# Patient Record
Sex: Male | Born: 1990 | Race: Black or African American | Hispanic: No | Marital: Single | Smoking: Never smoker
Health system: Southern US, Community
[De-identification: ages and names within clinical notes are randomized; demographics above are authoritative.]

## PROBLEM LIST (undated history)

## (undated) DIAGNOSIS — K5010 Crohn's disease of large intestine without complications: Secondary | ICD-10-CM

## (undated) DIAGNOSIS — L739 Follicular disorder, unspecified: Secondary | ICD-10-CM

---

## 2013-04-26 ENCOUNTER — Emergency Department: Payer: Self-pay | Admitting: Emergency Medicine

## 2017-04-02 ENCOUNTER — Other Ambulatory Visit: Payer: Self-pay

## 2017-04-02 ENCOUNTER — Encounter (HOSPITAL_COMMUNITY): Payer: Self-pay | Admitting: *Deleted

## 2017-04-02 DIAGNOSIS — Z79899 Other long term (current) drug therapy: Secondary | ICD-10-CM | POA: Diagnosis not present

## 2017-04-02 DIAGNOSIS — R109 Unspecified abdominal pain: Secondary | ICD-10-CM | POA: Insufficient documentation

## 2017-04-02 DIAGNOSIS — R55 Syncope and collapse: Secondary | ICD-10-CM | POA: Diagnosis not present

## 2017-04-02 DIAGNOSIS — M25562 Pain in left knee: Secondary | ICD-10-CM | POA: Insufficient documentation

## 2017-04-02 DIAGNOSIS — M79642 Pain in left hand: Secondary | ICD-10-CM | POA: Diagnosis not present

## 2017-04-02 DIAGNOSIS — L739 Follicular disorder, unspecified: Secondary | ICD-10-CM | POA: Diagnosis not present

## 2017-04-02 DIAGNOSIS — Z91041 Radiographic dye allergy status: Secondary | ICD-10-CM | POA: Diagnosis not present

## 2017-04-02 DIAGNOSIS — K5090 Crohn's disease, unspecified, without complications: Secondary | ICD-10-CM | POA: Diagnosis not present

## 2017-04-02 DIAGNOSIS — E861 Hypovolemia: Secondary | ICD-10-CM | POA: Insufficient documentation

## 2017-04-02 DIAGNOSIS — W1830XA Fall on same level, unspecified, initial encounter: Secondary | ICD-10-CM | POA: Insufficient documentation

## 2017-04-02 LAB — URINALYSIS, ROUTINE W REFLEX MICROSCOPIC
Bilirubin Urine: NEGATIVE
Glucose, UA: NEGATIVE mg/dL
Hgb urine dipstick: NEGATIVE
Ketones, ur: NEGATIVE mg/dL
LEUKOCYTES UA: NEGATIVE
NITRITE: NEGATIVE
PROTEIN: NEGATIVE mg/dL
Specific Gravity, Urine: 1.01 (ref 1.005–1.030)
pH: 9 — ABNORMAL HIGH (ref 5.0–8.0)

## 2017-04-02 LAB — CBC
HCT: 39.1 % (ref 39.0–52.0)
Hemoglobin: 13.6 g/dL (ref 13.0–17.0)
MCH: 30.6 pg (ref 26.0–34.0)
MCHC: 34.8 g/dL (ref 30.0–36.0)
MCV: 87.9 fL (ref 78.0–100.0)
PLATELETS: 330 10*3/uL (ref 150–400)
RBC: 4.45 MIL/uL (ref 4.22–5.81)
RDW: 13 % (ref 11.5–15.5)
WBC: 9.8 10*3/uL (ref 4.0–10.5)

## 2017-04-02 NOTE — ED Triage Notes (Signed)
Patient says that he has not been feeling well today. He has been having a chron's flare up over the past several weeks. This evening was outside and passed out. He c/o pain in the left jaw, left hand and left knee. He says that he has had several similar episodes recently. Has had some blood in his stool "but nothing significant today" .

## 2017-04-03 ENCOUNTER — Observation Stay (HOSPITAL_COMMUNITY)
Admission: EM | Admit: 2017-04-03 | Discharge: 2017-04-03 | Disposition: A | Discharge status: Home / Self Care | Payer: Medicaid Other | Attending: Internal Medicine | Admitting: Internal Medicine

## 2017-04-03 ENCOUNTER — Observation Stay (HOSPITAL_BASED_OUTPATIENT_CLINIC_OR_DEPARTMENT_OTHER): Payer: Medicaid Other

## 2017-04-03 ENCOUNTER — Emergency Department (HOSPITAL_COMMUNITY): Payer: Medicaid Other

## 2017-04-03 ENCOUNTER — Encounter (HOSPITAL_COMMUNITY): Payer: Self-pay | Admitting: Family Medicine

## 2017-04-03 DIAGNOSIS — L739 Follicular disorder, unspecified: Secondary | ICD-10-CM | POA: Diagnosis not present

## 2017-04-03 DIAGNOSIS — K5090 Crohn's disease, unspecified, without complications: Secondary | ICD-10-CM

## 2017-04-03 DIAGNOSIS — K5010 Crohn's disease of large intestine without complications: Secondary | ICD-10-CM | POA: Diagnosis not present

## 2017-04-03 DIAGNOSIS — I340 Nonrheumatic mitral (valve) insufficiency: Secondary | ICD-10-CM

## 2017-04-03 DIAGNOSIS — R55 Syncope and collapse: Secondary | ICD-10-CM | POA: Diagnosis present

## 2017-04-03 DIAGNOSIS — I951 Orthostatic hypotension: Secondary | ICD-10-CM | POA: Diagnosis present

## 2017-04-03 HISTORY — DX: Follicular disorder, unspecified: L73.9

## 2017-04-03 HISTORY — DX: Crohn's disease of large intestine without complications: K50.10

## 2017-04-03 LAB — BASIC METABOLIC PANEL
ANION GAP: 9 (ref 5–15)
Anion gap: 7 (ref 5–15)
CALCIUM: 9.1 mg/dL (ref 8.9–10.3)
CALCIUM: 8.3 mg/dL — AB (ref 8.9–10.3)
CO2: 27 mmol/L (ref 22–32)
CO2: 25 mmol/L (ref 22–32)
Chloride: 97 mmol/L — ABNORMAL LOW (ref 101–111)
Chloride: 104 mmol/L (ref 101–111)
Creatinine, Ser: 1.08 mg/dL (ref 0.61–1.24)
Creatinine, Ser: 0.9 mg/dL (ref 0.61–1.24)
GFR calc Af Amer: >60 mL/min (ref >60)
GFR calc Af Amer: >60 mL/min (ref >60)
GLUCOSE: 108 mg/dL — AB (ref 65–99)
Glucose, Bld: 104 mg/dL — ABNORMAL HIGH (ref 65–99)
POTASSIUM: 3.9 mmol/L (ref 3.5–5.1)
POTASSIUM: 3.8 mmol/L (ref 3.5–5.1)
SODIUM: 136 mmol/L (ref 135–145)
Sodium: 133 mmol/L — ABNORMAL LOW (ref 135–145)

## 2017-04-03 LAB — RAPID URINE DRUG SCREEN, HOSP PERFORMED
Amphetamines: NOT DETECTED
BARBITURATES: NOT DETECTED
BENZODIAZEPINES: NOT DETECTED
COCAINE: NOT DETECTED
Opiates: NOT DETECTED
TETRAHYDROCANNABINOL: POSITIVE — AB

## 2017-04-03 LAB — CBC
HEMATOCRIT: 34.7 % — AB (ref 39.0–52.0)
Hemoglobin: 11.7 g/dL — ABNORMAL LOW (ref 13.0–17.0)
MCH: 29.6 pg (ref 26.0–34.0)
MCHC: 33.7 g/dL (ref 30.0–36.0)
MCV: 87.8 fL (ref 78.0–100.0)
PLATELETS: 302 10*3/uL (ref 150–400)
RBC: 3.95 MIL/uL — ABNORMAL LOW (ref 4.22–5.81)
RDW: 13 % (ref 11.5–15.5)
WBC: 9 10*3/uL (ref 4.0–10.5)

## 2017-04-03 LAB — SEDIMENTATION RATE: Sed Rate: 25 mm/hr — ABNORMAL HIGH (ref 0–16)

## 2017-04-03 LAB — HIV ANTIBODY (ROUTINE TESTING W REFLEX): HIV Screen 4th Generation wRfx: NONREACTIVE

## 2017-04-03 LAB — ECHOCARDIOGRAM COMPLETE

## 2017-04-03 LAB — I-STAT CG4 LACTIC ACID, ED: Lactic Acid, Venous: 0.98 mmol/L (ref 0.5–1.9)

## 2017-04-03 LAB — C-REACTIVE PROTEIN: CRP: 1.9 mg/dL — ABNORMAL HIGH (ref <1.0)

## 2017-04-03 LAB — CBG MONITORING, ED: GLUCOSE-CAPILLARY: 112 mg/dL — AB (ref 65–99)

## 2017-04-03 MED ORDER — ONDANSETRON HCL 4 MG/2ML IJ SOLN: 4.0000 mg | Freq: Four times a day (QID) | INTRAMUSCULAR | Status: DC | PRN

## 2017-04-03 MED ORDER — SODIUM CHLORIDE 0.9% FLUSH: 3.0000 mL | Freq: Two times a day (BID) | INTRAVENOUS | Status: DC

## 2017-04-03 MED ORDER — DICYCLOMINE HCL 10 MG PO CAPS: 10.0000 mg | ORAL_CAPSULE | Freq: Four times a day (QID) | ORAL | Status: DC | PRN

## 2017-04-03 MED ORDER — SODIUM CHLORIDE 0.9 % IV BOLUS (SEPSIS)
1000.0000 mL | Freq: Once | INTRAVENOUS | Status: AC
  Administered 2017-04-03: 1000 mL via INTRAVENOUS

## 2017-04-03 MED ORDER — FLUOCINONIDE 0.05 % EX OINT
TOPICAL_OINTMENT | Freq: Three times a day (TID) | CUTANEOUS | Status: DC
  Filled 2017-04-03 (×2): qty 15

## 2017-04-03 MED ORDER — DOXYCYCLINE HYCLATE 100 MG PO TABS: 100.0000 mg | ORAL_TABLET | Freq: Two times a day (BID) | ORAL | 0 refills | Status: AC

## 2017-04-03 MED ORDER — PREDNISONE 20 MG PO TABS: 40.0000 mg | ORAL_TABLET | Freq: Every day | ORAL | Status: DC

## 2017-04-03 MED ORDER — METHYLPREDNISOLONE SODIUM SUCC 125 MG IJ SOLR
80.0000 mg | Freq: Once | INTRAMUSCULAR | Status: AC
  Administered 2017-04-03: 80 mg via INTRAVENOUS
  Filled 2017-04-03: qty 2

## 2017-04-03 MED ORDER — HYDROCODONE-ACETAMINOPHEN 5-325 MG PO TABS
1.0000 | ORAL_TABLET | ORAL | Status: DC | PRN
  Administered 2017-04-03: 1 via ORAL
  Administered 2017-04-03: 2 via ORAL
  Filled 2017-04-03: qty 1
  Filled 2017-04-03: qty 2

## 2017-04-03 MED ORDER — ADULT MULTIVITAMIN W/MINERALS CH
1.0000 | ORAL_TABLET | Freq: Every day | ORAL | Status: DC
  Administered 2017-04-03: 1 via ORAL
  Filled 2017-04-03: qty 1

## 2017-04-03 MED ORDER — FENTANYL CITRATE (PF) 100 MCG/2ML IJ SOLN
50.0000 ug | Freq: Once | INTRAMUSCULAR | Status: AC
  Administered 2017-04-03: 50 ug via INTRAVENOUS
  Filled 2017-04-03: qty 2

## 2017-04-03 MED ORDER — DOXYCYCLINE HYCLATE 100 MG PO TABS
100.0000 mg | ORAL_TABLET | Freq: Two times a day (BID) | ORAL | Status: DC
  Administered 2017-04-03: 100 mg via ORAL
  Filled 2017-04-03: qty 1

## 2017-04-03 MED ORDER — ONDANSETRON HCL 4 MG/2ML IJ SOLN
4.0000 mg | Freq: Once | INTRAMUSCULAR | Status: AC
  Administered 2017-04-03: 4 mg via INTRAVENOUS
  Filled 2017-04-03: qty 2

## 2017-04-03 MED ORDER — ONDANSETRON HCL 4 MG PO TABS: 4.0000 mg | ORAL_TABLET | Freq: Four times a day (QID) | ORAL | 0 refills | Status: AC | PRN

## 2017-04-03 MED ORDER — TRAMADOL HCL 50 MG PO TABS: 50.0000 mg | ORAL_TABLET | Freq: Four times a day (QID) | ORAL | 0 refills | Status: AC | PRN

## 2017-04-03 MED ORDER — ONDANSETRON HCL 4 MG PO TABS: 4.0000 mg | ORAL_TABLET | Freq: Four times a day (QID) | ORAL | Status: DC | PRN

## 2017-04-03 MED ORDER — TETANUS-DIPHTH-ACELL PERTUSSIS 5-2.5-18.5 LF-MCG/0.5 IM SUSP
0.5000 mL | Freq: Once | INTRAMUSCULAR | Status: AC
  Administered 2017-04-03: 0.5 mL via INTRAMUSCULAR
  Filled 2017-04-03: qty 0.5

## 2017-04-03 MED ORDER — ACETAMINOPHEN 325 MG PO TABS
650.0000 mg | ORAL_TABLET | Freq: Four times a day (QID) | ORAL | Status: DC | PRN
  Administered 2017-04-03: 650 mg via ORAL
  Filled 2017-04-03 (×2): qty 2

## 2017-04-03 MED ORDER — SODIUM CHLORIDE 0.9 % IV SOLN
INTRAVENOUS | Status: AC
  Administered 2017-04-03: 06:00:00 via INTRAVENOUS

## 2017-04-03 MED ORDER — DICYCLOMINE HCL 10 MG PO CAPS: 10.0000 mg | ORAL_CAPSULE | Freq: Four times a day (QID) | ORAL | 0 refills | Status: AC | PRN

## 2017-04-03 MED ORDER — ACETAMINOPHEN 650 MG RE SUPP: 650.0000 mg | Freq: Four times a day (QID) | RECTAL | Status: DC | PRN

## 2017-04-03 MED ORDER — PREDNISONE 20 MG PO TABS: 40.0000 mg | ORAL_TABLET | Freq: Every day | ORAL | 0 refills | Status: AC

## 2017-04-03 MED ORDER — FLUOCINONIDE 0.05 % EX OINT: TOPICAL_OINTMENT | Freq: Three times a day (TID) | CUTANEOUS | 0 refills | Status: AC

## 2017-04-03 NOTE — Discharge Summary (Signed)
Physician Discharge Summary  Uber Barwick WTU:[id number] DOB: 09/25/1990 DOA: 04/03/2017  PCP: Donn Pierini  Admit date: 04/03/2017 Discharge date: 04/03/2017  Admitted From: Home Disposition:  Home  Recommendations for Outpatient Follow-up:  1. Follow up with PCP in 1 week 2. Follow up with gastroenterology in a week 3. Patient will benefit from outpatient referral to dermatology   Home Health: No  Equipment/Devices: None  Discharge Condition: Stable  CODE STATUS: Full  Diet recommendation: Regular  Brief/Interim Summary: 26 year old male with history of Crohn's colitis and colon colitis presented with a syncopal episode. Patient was also complaining of suspected Crohn's flare. CT scan of the abdomen and pelvis was negative for acute process. GI evaluated the patient and recommended oral prednisone and outpatient follow-up with the primary gastroenterologist. Echo did not reveal any abnormalities. Patient will be discharged home, as he is hemodynamically stable.  Discharge Diagnoses:  Principal Problem:   Orthostatic syncope Active Problems:   Crohn's colitis (HCC)   Syncope   Folliculitis   1. Syncope  - Pt presents after a syncopal episode shortly after standing and immediately preceded by feeling hot and lightheaded  - Suspected to be orthostatic in setting of several loose stools daily and apparent hypovolemia; orthostatic vitals negative, however - Treated with IV fluids. Improved - Echo was normal -Outpatient follow-up with primary care provider  2. Crohn's colitis  -  Non-contrast CT abd/pelvis negative for acute process - GI evaluation appreciated. GI recommends prednisone 40 mg daily until the patient follows up with the primary gastroenterologist in a week's time who will then decide about continuation versus tapering off prednisone.   3. Folliculitis  - Severe folliculitis noted on presentation; pt reports this is chronic but acutely worsened over past  several days  - Continue doxycycline and topical treatments. -Patient might benefit from outpatient dermatology evaluation   Discharge Instructions  Discharge Instructions    Ambulatory referral to Dermatology   Complete by:  As directed    In 1-2 weeks; Eczema/Folliculitis   Call MD for:  difficulty breathing, headache or visual disturbances   Complete by:  As directed    Call MD for:  extreme fatigue   Complete by:  As directed    Call MD for:  hives   Complete by:  As directed    Call MD for:  persistant dizziness or light-headedness   Complete by:  As directed    Call MD for:  persistant nausea and vomiting   Complete by:  As directed    Call MD for:  severe uncontrolled pain   Complete by:  As directed    Call MD for:  temperature >100.4   Complete by:  As directed    Diet general   Complete by:  As directed    Increase activity slowly   Complete by:  As directed      Allergies as of 04/03/2017      Reactions   Contrast Media [iodinated Diagnostic Agents] Rash, Other (See Comments)   Burning sensation in veins    Peanut-containing Drug Products Rash   Shellfish Allergy Rash      Medication List    STOP taking these medications   ACTIVATED CHARCOAL PO     TAKE these medications   dicyclomine 10 MG capsule Commonly known as:  BENTYL Take 1 capsule (10 mg total) by mouth every 6 (six) hours as needed for spasms (abdominal pain/cramps).   doxycycline 100 MG tablet Commonly known as:  VIBRA-TABS Take 1  tablet (100 mg total) by mouth every 12 (twelve) hours.   fluocinonide ointment 0.05 % Commonly known as:  LIDEX Apply topically 3 (three) times daily.   multivitamin with minerals Tabs tablet Take 1 tablet by mouth daily.   ondansetron 4 MG tablet Commonly known as:  ZOFRAN Take 1 tablet (4 mg total) by mouth every 6 (six) hours as needed for nausea.   predniSONE 20 MG tablet Commonly known as:  DELTASONE Take 2 tablets (40 mg total) by mouth daily  before breakfast. Follow up with GI as an outpatient regarding need to taper or continue Start taking on:  04/04/2017   PROBIOTIC ACIDOPHILUS PO Take 1 tablet by mouth daily.   traMADol 50 MG tablet Commonly known as:  ULTRAM Take 1 tablet (50 mg total) by mouth every 6 (six) hours as needed for severe pain.      Follow-up Information    Donn Pierini. Schedule an appointment as soon as possible for a visit in 1 week(s).   Specialties:  Family Medicine, Sports Medicine Contact information: [address] [place] [address] [phone]        Willa Rough, MD. Schedule an appointment as soon as possible for a visit.   Contact information: [facility] [address] [place] [address] [phone]          Allergies  Allergen Reactions  . Contrast Media [Iodinated Diagnostic Agents] Rash and Other (See Comments)    Burning sensation in veins   . Peanut-Containing Drug Products Rash  . Shellfish Allergy Rash    Consultations:  GI   Procedures/Studies: Ct Abdomen Pelvis Wo Contrast  Result Date: 04/03/2017 CLINICAL DATA:  26 y/o M; history of Crohn's disease. Evaluation Crohn's exacerbation. Patient was dizzy with fall to concrete this evening. EXAM: CT ABDOMEN AND PELVIS WITHOUT CONTRAST TECHNIQUE: Multidetector CT imaging of the abdomen and pelvis was performed following the standard protocol without IV contrast. COMPARISON:  None. FINDINGS: Lower chest: No acute abnormality. Hepatobiliary: No focal liver abnormality is seen. No gallstones, gallbladder wall thickening, or biliary dilatation. Pancreas: Unremarkable. No pancreatic ductal dilatation or surrounding inflammatory changes. Spleen: Normal in size without focal abnormality. Adrenals/Urinary Tract: Adrenal glands are unremarkable. Kidneys are normal, without renal calculi, focal lesion, or hydronephrosis. Bladder is unremarkable. Stomach/Bowel: Stomach is within  normal limits. Appendix appears normal. No evidence of bowel wall thickening, distention, or inflammatory changes. Vascular/Lymphatic: No significant vascular findings are present. No enlarged abdominal or pelvic lymph nodes. Reproductive: Uterus and bilateral adnexa are unremarkable. Other: No abdominal wall hernia or abnormality. No abdominopelvic ascites. Musculoskeletal: No acute or significant osseous findings. IMPRESSION: No acute process identified. Active inflammation of Crohn's disease may not be identified without contrast. CT abdomen and pelvis with contrast or CT/MR enterography are the studies of choice for evaluation of Crohn's disease. Electronically Signed   By: Mitzi Hansen M.D.   On: 04/03/2017 03:01   Ct Head Wo Contrast  Result Date: 04/03/2017 CLINICAL DATA:  26 year old male with trauma. EXAM: CT HEAD WITHOUT CONTRAST CT MAXILLOFACIAL WITHOUT CONTRAST CT CERVICAL SPINE WITHOUT CONTRAST TECHNIQUE: Multidetector CT imaging of the head, cervical spine, and maxillofacial structures were performed using the standard protocol without intravenous contrast. Multiplanar CT image reconstructions of the cervical spine and maxillofacial structures were also generated. COMPARISON:  None. FINDINGS: CT HEAD FINDINGS Brain: No evidence of acute infarction, hemorrhage, hydrocephalus, extra-axial collection or mass lesion/mass effect. Vascular: No hyperdense vessel or unexpected calcification. Skull: Normal. Negative  for fracture or focal lesion. Other: None CT MAXILLOFACIAL FINDINGS Osseous: No fracture or mandibular dislocation. No destructive process. Orbits: Negative. No traumatic or inflammatory finding. Sinuses: Mild mucoperiosteal thickening of paranasal sinuses. No air-fluid levels. The mastoid air cells are clear. Soft tissues: Negative. CT CERVICAL SPINE FINDINGS Alignment: No acute subluxation. Mild reversal of normal cervical lordosis likely positional or due to muscle spasm. Skull  base and vertebrae: No acute fracture. No primary bone lesion or focal pathologic process. Soft tissues and spinal canal: No prevertebral fluid or swelling. No visible canal hematoma. Disc levels:  No acute findings. Upper chest: Negative. Other: None IMPRESSION: 1. Normal noncontrast CT of the brain. 2. No acute facial bone fractures. 3. No acute/traumatic cervical spine pathology. Electronically Signed   By: Elgie Collard M.D.   On: 04/03/2017 02:58   Ct Cervical Spine Wo Contrast  Result Date: 04/03/2017 CLINICAL DATA:  26 year old male with trauma. EXAM: CT HEAD WITHOUT CONTRAST CT MAXILLOFACIAL WITHOUT CONTRAST CT CERVICAL SPINE WITHOUT CONTRAST TECHNIQUE: Multidetector CT imaging of the head, cervical spine, and maxillofacial structures were performed using the standard protocol without intravenous contrast. Multiplanar CT image reconstructions of the cervical spine and maxillofacial structures were also generated. COMPARISON:  None. FINDINGS: CT HEAD FINDINGS Brain: No evidence of acute infarction, hemorrhage, hydrocephalus, extra-axial collection or mass lesion/mass effect. Vascular: No hyperdense vessel or unexpected calcification. Skull: Normal. Negative for fracture or focal lesion. Other: None CT MAXILLOFACIAL FINDINGS Osseous: No fracture or mandibular dislocation. No destructive process. Orbits: Negative. No traumatic or inflammatory finding. Sinuses: Mild mucoperiosteal thickening of paranasal sinuses. No air-fluid levels. The mastoid air cells are clear. Soft tissues: Negative. CT CERVICAL SPINE FINDINGS Alignment: No acute subluxation. Mild reversal of normal cervical lordosis likely positional or due to muscle spasm. Skull base and vertebrae: No acute fracture. No primary bone lesion or focal pathologic process. Soft tissues and spinal canal: No prevertebral fluid or swelling. No visible canal hematoma. Disc levels:  No acute findings. Upper chest: Negative. Other: None IMPRESSION: 1. Normal  noncontrast CT of the brain. 2. No acute facial bone fractures. 3. No acute/traumatic cervical spine pathology. Electronically Signed   By: Elgie Collard M.D.   On: 04/03/2017 02:58   Dg Knee Complete 4 Views Left  Result Date: 04/03/2017 CLINICAL DATA:  26 year old male with fall and left knee pain. EXAM: LEFT KNEE - COMPLETE 4+ VIEW COMPARISON:  None. FINDINGS: No evidence of fracture, dislocation, or joint effusion. No evidence of arthropathy or other focal bone abnormality. Soft tissues are unremarkable. IMPRESSION: Negative. Electronically Signed   By: Elgie Collard M.D.   On: 04/03/2017 02:44   Dg Hand Complete Left  Result Date: 04/03/2017 CLINICAL DATA:  26 year old male with fall and left hand pain. EXAM: LEFT HAND - COMPLETE 3+ VIEW COMPARISON:  None. FINDINGS: There is no evidence of fracture or dislocation. There is no evidence of arthropathy or other focal bone abnormality. Soft tissues are unremarkable. IMPRESSION: Negative. Electronically Signed   By: Elgie Collard M.D.   On: 04/03/2017 02:43   Ct Maxillofacial Wo Contrast  Result Date: 04/03/2017 CLINICAL DATA:  26 year old male with trauma. EXAM: CT HEAD WITHOUT CONTRAST CT MAXILLOFACIAL WITHOUT CONTRAST CT CERVICAL SPINE WITHOUT CONTRAST TECHNIQUE: Multidetector CT imaging of the head, cervical spine, and maxillofacial structures were performed using the standard protocol without intravenous contrast. Multiplanar CT image reconstructions of the cervical spine and maxillofacial structures were also generated. COMPARISON:  None. FINDINGS: CT HEAD FINDINGS Brain: No evidence of acute  infarction, hemorrhage, hydrocephalus, extra-axial collection or mass lesion/mass effect. Vascular: No hyperdense vessel or unexpected calcification. Skull: Normal. Negative for fracture or focal lesion. Other: None CT MAXILLOFACIAL FINDINGS Osseous: No fracture or mandibular dislocation. No destructive process. Orbits: Negative. No traumatic or  inflammatory finding. Sinuses: Mild mucoperiosteal thickening of paranasal sinuses. No air-fluid levels. The mastoid air cells are clear. Soft tissues: Negative. CT CERVICAL SPINE FINDINGS Alignment: No acute subluxation. Mild reversal of normal cervical lordosis likely positional or due to muscle spasm. Skull base and vertebrae: No acute fracture. No primary bone lesion or focal pathologic process. Soft tissues and spinal canal: No prevertebral fluid or swelling. No visible canal hematoma. Disc levels:  No acute findings. Upper chest: Negative. Other: None IMPRESSION: 1. Normal noncontrast CT of the brain. 2. No acute facial bone fractures. 3. No acute/traumatic cervical spine pathology. Electronically Signed   By: Elgie Collard M.D.   On: 04/03/2017 02:58    Echo on 04/03/2017 Study Conclusions  - Left ventricle: The cavity size was normal. There was mild   concentric hypertrophy. Systolic function was vigorous. The   estimated ejection fraction was in the range of 65% to 70%. Wall   motion was normal; there were no regional wall motion   abnormalities. - Aortic valve: There was no regurgitation. - Mitral valve: There was mild regurgitation. - Right ventricle: The cavity size was normal. Wall thickness was   normal. Systolic function was normal. - Right atrium: The atrium was normal in size. - Tricuspid valve: There was trivial regurgitation. - Pulmonic valve: There was no regurgitation. - Pulmonary arteries: Systolic pressure was within the normal   range. - Inferior vena cava: The vessel was normal in size. - Pericardium, extracardiac: There was no pericardial effusion.  Impressions:  - Normal study.   Subjective: Patient seen and examined at bedside. He complains of mild abdominal pain and lower extremity pain. No current nausea or vomiting or fever.  Discharge Exam: Vitals:   04/03/17 1400 04/03/17 1430  BP: 105/70 102/62  Pulse: (!) 59 78  Resp: 13 13  Temp:     SpO2: 98% 98%   Vitals:   04/03/17 1200 04/03/17 1330 04/03/17 1400 04/03/17 1430  BP: (!) 107/59 123/72 105/70 102/62  Pulse: 87 (!) 107 (!) 59 78  Resp: 18 17 13 13   Temp:      TempSrc:      SpO2: 100% 100% 98% 98%    General: Pt is alert, awake, not in acute distress Cardiovascular: Rate controlled, S1/S2 + Respiratory: Bilateral decreased breath sounds bases  Abdominal: Soft, mildly tender on palpation, no rebound tenderness, ND, bowel sounds + Skin: Superficial abrasions to left chin, left hand, and left knee. Erythematous papules and pustules scattered about the trunk and extremities. Skin otherwise dry, warm, well-perfused.       The results of significant diagnostics from this hospitalization (including imaging, microbiology, ancillary and laboratory) are listed below for reference.     Microbiology: No results found for this or any previous visit (from the past 240 hour(s)).   Labs: BNP (last 3 results) No results for input(s): BNP in the last 8760 hours. Basic Metabolic Panel: Recent Labs  Lab 04/02/17 2328 04/03/17 0403  NA 133* 136  K 3.9 3.8  CL 97* 104  CO2 27 25  GLUCOSE 108* 104*  BUN <5* <5*  CREATININE 1.08 0.90  CALCIUM 9.1 8.3*   Liver Function Tests: No results for input(s): AST, ALT, ALKPHOS, BILITOT, PROT, ALBUMIN  in the last 168 hours. No results for input(s): LIPASE, AMYLASE in the last 168 hours. No results for input(s): AMMONIA in the last 168 hours. CBC: Recent Labs  Lab 04/02/17 2328 04/03/17 0403  WBC 9.8 9.0  HGB 13.6 11.7*  HCT 39.1 34.7*  MCV 87.9 87.8  PLT 330 302   Cardiac Enzymes: No results for input(s): CKTOTAL, CKMB, CKMBINDEX, TROPONINI in the last 168 hours. BNP: Invalid input(s): POCBNP CBG: Recent Labs  Lab 04/03/17 0804  GLUCAP 112*   D-Dimer No results for input(s): DDIMER in the last 72 hours. Hgb A1c No results for input(s): HGBA1C in the last 72 hours. Lipid Profile No results for input(s):  CHOL, HDL, LDLCALC, TRIG, CHOLHDL, LDLDIRECT in the last 72 hours. Thyroid function studies No results for input(s): TSH, T4TOTAL, T3FREE, THYROIDAB in the last 72 hours.  Invalid input(s): FREET3 Anemia work up No results for input(s): VITAMINB12, FOLATE, FERRITIN, TIBC, IRON, RETICCTPCT in the last 72 hours. Urinalysis    Component Value Date/Time   COLORURINE YELLOW 04/02/2017 2335   APPEARANCEUR CLEAR 04/02/2017 2335   LABSPEC 1.010 04/02/2017 2335   PHURINE 9.0 (H) 04/02/2017 2335   GLUCOSEU NEGATIVE 04/02/2017 2335   HGBUR NEGATIVE 04/02/2017 2335   BILIRUBINUR NEGATIVE 04/02/2017 2335   KETONESUR NEGATIVE 04/02/2017 2335   PROTEINUR NEGATIVE 04/02/2017 2335   NITRITE NEGATIVE 04/02/2017 2335   LEUKOCYTESUR NEGATIVE 04/02/2017 2335   Sepsis Labs Invalid input(s): PROCALCITONIN,  WBC,  LACTICIDVEN Microbiology No results found for this or any previous visit (from the past 240 hour(s)).   Time coordinating discharge: 30 minutes  SIGNED:   Glade LloydKshitiz Mckinnley Cottier, MD  [facility] 04/03/2017, 3:20 PM Pager: [phone]  If 7PM-7AM, please contact night-coverage [email or web address] Password [email or web address]

## 2017-04-03 NOTE — Progress Notes (Signed)
Patient ID: Reginald Valentine, male   DOB: 04/28/1990, 28 y.o.   MRN: [id number] Patient was admitted early this morning for syncope.  He states that he is having a Crohn's  Flare.  He states that he follows up with a GI doctor as an outpatient but wants to see someone else from now.  He also does not have a primary care provider.  He is requesting an outpatient dermatology referral as well.  Will consult nurse case manager for arrangement of primary care provider.  Will consult GI regarding his concerns of a Crohn's flare.  Continue orthostatic vitals.  Echo report pending.

## 2017-04-03 NOTE — H&P (Signed)
History and Physical    Reginald Valentine UEA:[id number] DOB: 1990/10/19 DOA: 04/03/2017  PCP: Donn Pierini   Patient coming from: Home  Chief Complaint: Syncope   HPI: Reginald Valentine is a 26 y.o. male with medical history significant for Crohn's colitis and folliculitis, now presenting to the emergency department after syncopal episode.  The patient reports that he had been suffering from suspected Crohn's flare for the past several days with 5-10 loose stools daily, as well as a flare in his folliculitis.  He was sitting on his porch yesterday evening, talking with friends, got up to go inside, and suddenly began to feel hot and lightheaded.  He then suffered an apparent syncopal episode, falling forward onto his left side.  Denies any chest pain, fevers, nausea, or vomiting.  There was blood in his stool last week, but none in the past few days.  He reports pain involving the left jaw, left hand, and left knee after the fall.  No focal numbness or weakness.  Reports ongoing abdominal pain, waxing and waning, cramping in character, and nonradiating.  ED Course: Upon arrival to the ED, patient is found to be afebrile, saturating well on room air, and with vitals otherwise stable.  Orthostatic vitals are negative.  EKG features a sinus tachycardia with rate 103.  Radiographs of the left hand and knee are not noncontrast head CT is normal, and maxillofacial CT is without fractures, and cervical spine CTs without acute pathology.  CT of the abdomen and pelvis is also negative for acute process.  Chemistry panel is notable for a hyponatremia and hypochloremia, and CBC is unremarkable.  Lactic acid is reassuring at 0.98.  Patient was treated with 2 L of normal saline, Zofran, and fentanyl in the emergency department.  He also had Tdap updated and was given 80 mg IV Solu-Medrol for potential Crohn's flare.  Patient has remained hemodynamically stable and in no distress.  He will be observed on the telemetry  unit for ongoing evaluation and management of a syncopal episode, likely orthostatic in the setting of recent loose stools.  Review of Systems:  All other systems reviewed and apart from HPI, are negative.  Past Medical History:  Diagnosis Date  . Crohn's colitis (HCC)   . Folliculitis     History reviewed. No pertinent surgical history.   reports that  has never smoked. He does not have any smokeless tobacco history on file. He reports that he does not drink alcohol or use drugs.  Allergies  Allergen Reactions  . Contrast Media [Iodinated Diagnostic Agents] Rash and Other (See Comments)    Burning sensation in veins   . Peanut-Containing Drug Products Rash  . Shellfish Allergy Rash    History reviewed. No pertinent family history.   Prior to Admission medications   Medication Sig Start Date End Date Taking? Authorizing Provider  Charcoal Activated (ACTIVATED CHARCOAL PO) Take 2 tablets by mouth daily.   Yes [provider]  Lactobacillus (PROBIOTIC ACIDOPHILUS PO) Take 1 tablet by mouth daily.   Yes [provider]  Multiple Vitamin (MULTIVITAMIN WITH MINERALS) TABS tablet Take 1 tablet by mouth daily.   Yes [provider]    Physical Exam: Vitals:   04/02/17 2313  BP: 121/85  Pulse: 92  Resp: 18  Temp: 98.1 F (36.7 C)  TempSrc: Oral  SpO2: 98%      Constitutional: NAD, calm, in apparent discomfort, very thin Eyes: PERTLA, lids and conjunctivae normal ENMT: Mucous membranes are moist. Posterior  pharynx clear of any exudate or lesions.   Neck: normal, supple, no masses, no thyromegaly Respiratory: clear to auscultation bilaterally, no wheezing, no crackles. Normal respiratory effort.   Cardiovascular: S1 & S2 heard, regular rate and rhythm. No extremity edema. No significant JVD. Abdomen: No distension, soft, tender throughout. No rebound pain or guarding. Bowel sounds active.  Musculoskeletal: no clubbing / cyanosis. No joint  deformity upper and lower extremities.  Skin: Superficial abrasions to left chin, left hand, and left knee. Erythematous papules and pustules scattered about the trunk and extremities, most notably lower legs where there is some confluence. Skin otherwise dry, warm, well-perfused.  Neurologic: CN 2-12 grossly intact. Sensation intact. Strength 5/5 in all 4 limbs.  Psychiatric: Alert and oriented x 3. Calm, cooperative.     Labs on Admission: I have personally reviewed following labs and imaging studies  CBC: Recent Labs  Lab 04/02/17 2328  WBC 9.8  HGB 13.6  HCT 39.1  MCV 87.9  PLT 330   Basic Metabolic Panel: Recent Labs  Lab 04/02/17 2328  NA 133*  K 3.9  CL 97*  CO2 27  GLUCOSE 108*  BUN <5*  CREATININE 1.08  CALCIUM 9.1   GFR: CrCl cannot be calculated (Unknown ideal weight.). Liver Function Tests: No results for input(s): AST, ALT, ALKPHOS, BILITOT, PROT, ALBUMIN in the last 168 hours. No results for input(s): LIPASE, AMYLASE in the last 168 hours. No results for input(s): AMMONIA in the last 168 hours. Coagulation Profile: No results for input(s): INR, PROTIME in the last 168 hours. Cardiac Enzymes: No results for input(s): CKTOTAL, CKMB, CKMBINDEX, TROPONINI in the last 168 hours. BNP (last 3 results) No results for input(s): PROBNP in the last 8760 hours. HbA1C: No results for input(s): HGBA1C in the last 72 hours. CBG: No results for input(s): GLUCAP in the last 168 hours. Lipid Profile: No results for input(s): CHOL, HDL, LDLCALC, TRIG, CHOLHDL, LDLDIRECT in the last 72 hours. Thyroid Function Tests: No results for input(s): TSH, T4TOTAL, FREET4, T3FREE, THYROIDAB in the last 72 hours. Anemia Panel: No results for input(s): VITAMINB12, FOLATE, FERRITIN, TIBC, IRON, RETICCTPCT in the last 72 hours. Urine analysis:    Component Value Date/Time   COLORURINE YELLOW 04/02/2017 2335   APPEARANCEUR CLEAR 04/02/2017 2335   LABSPEC 1.010 04/02/2017 2335    PHURINE 9.0 (H) 04/02/2017 2335   GLUCOSEU NEGATIVE 04/02/2017 2335   HGBUR NEGATIVE 04/02/2017 2335   BILIRUBINUR NEGATIVE 04/02/2017 2335   KETONESUR NEGATIVE 04/02/2017 2335   PROTEINUR NEGATIVE 04/02/2017 2335   NITRITE NEGATIVE 04/02/2017 2335   LEUKOCYTESUR NEGATIVE 04/02/2017 2335   Sepsis Labs: @LABRCNTIP (procalcitonin:4,lacticidven:4) )No results found for this or any previous visit (from the past 240 hour(s)).   Radiological Exams on Admission: Ct Abdomen Pelvis Wo Contrast  Result Date: 04/03/2017 CLINICAL DATA:  26 y/o M; history of Crohn's disease. Evaluation Crohn's exacerbation. Patient was dizzy with fall to concrete this evening. EXAM: CT ABDOMEN AND PELVIS WITHOUT CONTRAST TECHNIQUE: Multidetector CT imaging of the abdomen and pelvis was performed following the standard protocol without IV contrast. COMPARISON:  None. FINDINGS: Lower chest: No acute abnormality. Hepatobiliary: No focal liver abnormality is seen. No gallstones, gallbladder wall thickening, or biliary dilatation. Pancreas: Unremarkable. No pancreatic ductal dilatation or surrounding inflammatory changes. Spleen: Normal in size without focal abnormality. Adrenals/Urinary Tract: Adrenal glands are unremarkable. Kidneys are normal, without renal calculi, focal lesion, or hydronephrosis. Bladder is unremarkable. Stomach/Bowel: Stomach is within normal limits. Appendix appears normal. No evidence of bowel  wall thickening, distention, or inflammatory changes. Vascular/Lymphatic: No significant vascular findings are present. No enlarged abdominal or pelvic lymph nodes. Reproductive: Uterus and bilateral adnexa are unremarkable. Other: No abdominal wall hernia or abnormality. No abdominopelvic ascites. Musculoskeletal: No acute or significant osseous findings. IMPRESSION: No acute process identified. Active inflammation of Crohn's disease may not be identified without contrast. CT abdomen and pelvis with contrast or CT/MR  enterography are the studies of choice for evaluation of Crohn's disease. Electronically Signed   By: Mitzi Hansen M.D.   On: 04/03/2017 03:01   Ct Head Wo Contrast  Result Date: 04/03/2017 CLINICAL DATA:  26 year old male with trauma. EXAM: CT HEAD WITHOUT CONTRAST CT MAXILLOFACIAL WITHOUT CONTRAST CT CERVICAL SPINE WITHOUT CONTRAST TECHNIQUE: Multidetector CT imaging of the head, cervical spine, and maxillofacial structures were performed using the standard protocol without intravenous contrast. Multiplanar CT image reconstructions of the cervical spine and maxillofacial structures were also generated. COMPARISON:  None. FINDINGS: CT HEAD FINDINGS Brain: No evidence of acute infarction, hemorrhage, hydrocephalus, extra-axial collection or mass lesion/mass effect. Vascular: No hyperdense vessel or unexpected calcification. Skull: Normal. Negative for fracture or focal lesion. Other: None CT MAXILLOFACIAL FINDINGS Osseous: No fracture or mandibular dislocation. No destructive process. Orbits: Negative. No traumatic or inflammatory finding. Sinuses: Mild mucoperiosteal thickening of paranasal sinuses. No air-fluid levels. The mastoid air cells are clear. Soft tissues: Negative. CT CERVICAL SPINE FINDINGS Alignment: No acute subluxation. Mild reversal of normal cervical lordosis likely positional or due to muscle spasm. Skull base and vertebrae: No acute fracture. No primary bone lesion or focal pathologic process. Soft tissues and spinal canal: No prevertebral fluid or swelling. No visible canal hematoma. Disc levels:  No acute findings. Upper chest: Negative. Other: None IMPRESSION: 1. Normal noncontrast CT of the brain. 2. No acute facial bone fractures. 3. No acute/traumatic cervical spine pathology. Electronically Signed   By: Elgie Collard M.D.   On: 04/03/2017 02:58   Ct Cervical Spine Wo Contrast  Result Date: 04/03/2017 CLINICAL DATA:  26 year old male with trauma. EXAM: CT HEAD WITHOUT  CONTRAST CT MAXILLOFACIAL WITHOUT CONTRAST CT CERVICAL SPINE WITHOUT CONTRAST TECHNIQUE: Multidetector CT imaging of the head, cervical spine, and maxillofacial structures were performed using the standard protocol without intravenous contrast. Multiplanar CT image reconstructions of the cervical spine and maxillofacial structures were also generated. COMPARISON:  None. FINDINGS: CT HEAD FINDINGS Brain: No evidence of acute infarction, hemorrhage, hydrocephalus, extra-axial collection or mass lesion/mass effect. Vascular: No hyperdense vessel or unexpected calcification. Skull: Normal. Negative for fracture or focal lesion. Other: None CT MAXILLOFACIAL FINDINGS Osseous: No fracture or mandibular dislocation. No destructive process. Orbits: Negative. No traumatic or inflammatory finding. Sinuses: Mild mucoperiosteal thickening of paranasal sinuses. No air-fluid levels. The mastoid air cells are clear. Soft tissues: Negative. CT CERVICAL SPINE FINDINGS Alignment: No acute subluxation. Mild reversal of normal cervical lordosis likely positional or due to muscle spasm. Skull base and vertebrae: No acute fracture. No primary bone lesion or focal pathologic process. Soft tissues and spinal canal: No prevertebral fluid or swelling. No visible canal hematoma. Disc levels:  No acute findings. Upper chest: Negative. Other: None IMPRESSION: 1. Normal noncontrast CT of the brain. 2. No acute facial bone fractures. 3. No acute/traumatic cervical spine pathology. Electronically Signed   By: Elgie Collard M.D.   On: 04/03/2017 02:58   Dg Knee Complete 4 Views Left  Result Date: 04/03/2017 CLINICAL DATA:  26 year old male with fall and left knee pain. EXAM: LEFT KNEE - COMPLETE 4+ VIEW  COMPARISON:  None. FINDINGS: No evidence of fracture, dislocation, or joint effusion. No evidence of arthropathy or other focal bone abnormality. Soft tissues are unremarkable. IMPRESSION: Negative. Electronically Signed   By: Elgie CollardArash  Radparvar  M.D.   On: 04/03/2017 02:44   Dg Hand Complete Left  Result Date: 04/03/2017 CLINICAL DATA:  26 year old male with fall and left hand pain. EXAM: LEFT HAND - COMPLETE 3+ VIEW COMPARISON:  None. FINDINGS: There is no evidence of fracture or dislocation. There is no evidence of arthropathy or other focal bone abnormality. Soft tissues are unremarkable. IMPRESSION: Negative. Electronically Signed   By: Elgie CollardArash  Radparvar M.D.   On: 04/03/2017 02:43   Ct Maxillofacial Wo Contrast  Result Date: 04/03/2017 CLINICAL DATA:  26 year old male with trauma. EXAM: CT HEAD WITHOUT CONTRAST CT MAXILLOFACIAL WITHOUT CONTRAST CT CERVICAL SPINE WITHOUT CONTRAST TECHNIQUE: Multidetector CT imaging of the head, cervical spine, and maxillofacial structures were performed using the standard protocol without intravenous contrast. Multiplanar CT image reconstructions of the cervical spine and maxillofacial structures were also generated. COMPARISON:  None. FINDINGS: CT HEAD FINDINGS Brain: No evidence of acute infarction, hemorrhage, hydrocephalus, extra-axial collection or mass lesion/mass effect. Vascular: No hyperdense vessel or unexpected calcification. Skull: Normal. Negative for fracture or focal lesion. Other: None CT MAXILLOFACIAL FINDINGS Osseous: No fracture or mandibular dislocation. No destructive process. Orbits: Negative. No traumatic or inflammatory finding. Sinuses: Mild mucoperiosteal thickening of paranasal sinuses. No air-fluid levels. The mastoid air cells are clear. Soft tissues: Negative. CT CERVICAL SPINE FINDINGS Alignment: No acute subluxation. Mild reversal of normal cervical lordosis likely positional or due to muscle spasm. Skull base and vertebrae: No acute fracture. No primary bone lesion or focal pathologic process. Soft tissues and spinal canal: No prevertebral fluid or swelling. No visible canal hematoma. Disc levels:  No acute findings. Upper chest: Negative. Other: None IMPRESSION: 1. Normal  noncontrast CT of the brain. 2. No acute facial bone fractures. 3. No acute/traumatic cervical spine pathology. Electronically Signed   By: Elgie CollardArash  Radparvar M.D.   On: 04/03/2017 02:58    EKG: Independently reviewed. Sinus tachycardia (rate 103).   Assessment/Plan  1. Syncope  - Pt presents after a syncopal episode shortly after standing and immediately preceded by feeling hot and lightheaded  - Suspected to be orthostatic in setting of several loose stools daily and apparent hypovolemia; orthostatic vitals negative, however - Treated in ED with 2 liters NS  - Plan to continue cardiac monitoring, check echocardiogram, continue IVF hydration   2. Crohn's colitis  - [facility] [place] and is managed with Remicade, last infusion 03/18/17  - Non-contrast CT abd/pelvis negative for acute process; abd tender on exam, but no peritoneal signs  - Denies any blood in stool recently  - Treated in ED with Solu-Medrol for suspected flare   - Check inflammatory markers, continue supportive care   3. Folliculitis  - Severe folliculitis noted on presentation; pt reports this is chronic but acutely worsened over past several days  - Start doxycycline, continue topical treatments   DVT prophylaxis: SCD's  Code Status: Full  Family Communication: Discussed with patient Disposition Plan: Observe on telemetry Consults called: None Admission status: Observation    Briscoe Deutscherimothy S Opyd, MD [facility] Pager [phone]  If 7PM-7AM, please contact night-coverage [email or web address] Password [email or web address]  04/03/2017, 4:02 AM

## 2017-04-03 NOTE — ED Notes (Signed)
Meal ordered for PT 

## 2017-04-03 NOTE — ED Notes (Signed)
PT's cardiac monitor alarms for possible afib. PT's heart rate is obviously irregular. PT reports no chest pain. Previous EKG did not show irregularity. EKG will be obtained and MD paged.

## 2017-04-03 NOTE — ED Notes (Signed)
Regular diet breakfast tray ordered @ 0445.

## 2017-04-03 NOTE — ED Notes (Signed)
Patient's breakfast at bedside. Patient made aware waiting for bed. Patient ask for estimated time advised could not give a time but while he is down here we will continue care as if he was admitted.,

## 2017-04-03 NOTE — ED Notes (Signed)
ED Provider at bedside. 

## 2017-04-03 NOTE — Care Management Note (Signed)
Case Management Note  CM consulted for PCP needs.  Note pt has ins.  PCP information already placed on AVS prior to CM looking.  Added [facility] for additional choice.  Updated primary RN, Lorin Picket.  No further CM needs noted at this time.

## 2017-04-03 NOTE — ED Notes (Signed)
generalized  rash noted around umbilicus

## 2017-04-03 NOTE — ED Notes (Signed)
Echo at bedside

## 2017-04-03 NOTE — ED Provider Notes (Signed)
Reginald Valentine [facility] EMERGENCY DEPARTMENT Provider Note   CSN: [id number] Arrival date & time: 04/02/17  2308     History   Chief Complaint Chief Complaint  Patient presents with  . Loss of Consciousness    HPI Reginald Valentine is a 26 y.o. male.  Patient with history of Crohn's disease but normally follows at [facility] presenting with syncopal episode today.  States he was standing outside talking on the porch when he began to feel hot and flushed and lightheaded.  He passed out and fell forward striking his face, left hand and left knee.  States he did lose consciousness.  complains of left jaw left hand and left knee pain.  Denies any chest pain or shortness of breath.  States he has not been feeling well for the past several days and believes he is having a Crohn's exacerbation.  Has had 5-10 loose stools daily but no blood in his stool since last week.  His last Humira injection was 2 weeks ago.  He has not had any other Crohn's medications.  Denies any fevers, nausea or vomiting.  Denies any surgeries due to his Crohn's.   The history is provided by the patient.  Loss of Consciousness   Associated symptoms include abdominal pain, back pain, dizziness, light-headedness and weakness. Pertinent negatives include chest pain, congestion, fever, nausea and vomiting.    Past Medical History:  Diagnosis Date  . Crohn's colitis (HCC)   . Folliculitis     There are no active problems to display for this patient.   History reviewed. No pertinent surgical history.     Home Medications    Prior to Admission medications   Not on File    Family History No family history on file.  Social History Social History   Tobacco Use  . Smoking status: Never Smoker  Substance Use Topics  . Alcohol use: No    Frequency: Never  . Drug use: No     Allergies   Patient has no allergy information on record.   Review of Systems Review of Systems    Constitutional: Positive for activity change, appetite change and fatigue. Negative for fever.  HENT: Negative for congestion.   Respiratory: Negative for cough, chest tightness and shortness of breath.   Cardiovascular: Positive for syncope. Negative for chest pain.  Gastrointestinal: Positive for abdominal pain and diarrhea. Negative for nausea and vomiting.  Genitourinary: Negative for dysuria, hematuria and testicular pain.  Musculoskeletal: Positive for arthralgias, back pain, myalgias and neck pain.  Skin: Positive for wound. Negative for rash.  Neurological: Positive for dizziness, syncope, weakness and light-headedness.   all other systems are negative except as noted in the HPI and PMH.     Physical Exam Updated Vital Signs BP 121/85 (BP Location: Left Arm)   Pulse 92   Temp 98.1 F (36.7 C) (Oral)   Resp 18   SpO2 98%   Physical Exam  Constitutional: He is oriented to person, place, and time. He appears well-developed and well-nourished. No distress.  HENT:  Head: Normocephalic and atraumatic.  Mouth/Throat: Oropharynx is clear and moist. No oropharyngeal exudate.  Abrasion to left chin.  No malocclusion, no trismus  Eyes: Conjunctivae and EOM are normal. Pupils are equal, round, and reactive to light.  Neck: Normal range of motion. Neck supple.  Diffuse paraspinal C-spine tenderness  Cardiovascular: Normal rate, regular rhythm, normal heart sounds and intact distal pulses.  No murmur heard. Pulmonary/Chest: Effort normal and breath sounds  normal. No respiratory distress.  Abdominal: Soft. There is tenderness. There is no rebound and no guarding.  Diffuse tenderness with voluntary guarding  Musculoskeletal: Normal range of motion. He exhibits no edema or tenderness.  Abrasions to left hand and left knee\ No bony tenderness  Neurological: He is alert and oriented to person, place, and time. No cranial nerve deficit. He exhibits normal muscle tone. Coordination  normal.  No ataxia on finger to nose bilaterally. No pronator drift. 5/5 strength throughout. CN 2-12 intact.Equal grip strength. Sensation intact.   Skin: Skin is warm. Rash noted.  Scattered pustular rash consistent with folliculitis over entire body  Psychiatric: He has a normal mood and affect. His behavior is normal.  Nursing note and vitals reviewed.    ED Treatments / Results  Labs (all labs ordered are listed, but only abnormal results are displayed) Labs Reviewed  BASIC METABOLIC PANEL - Abnormal; Notable for the following components:      Result Value   Sodium 133 (*)    Chloride 97 (*)    Glucose, Bld 108 (*)    BUN <5 (*)    All other components within normal limits  URINALYSIS, ROUTINE W REFLEX MICROSCOPIC - Abnormal; Notable for the following components:   pH 9.0 (*)    All other components within normal limits  SEDIMENTATION RATE - Abnormal; Notable for the following components:   Sed Rate 25 (*)    All other components within normal limits  C-REACTIVE PROTEIN - Abnormal; Notable for the following components:   CRP 1.9 (*)    All other components within normal limits  BASIC METABOLIC PANEL - Abnormal; Notable for the following components:   Glucose, Bld 104 (*)    BUN <5 (*)    Calcium 8.3 (*)    All other components within normal limits  CBC - Abnormal; Notable for the following components:   RBC 3.95 (*)    Hemoglobin 11.7 (*)    HCT 34.7 (*)    All other components within normal limits  CBC  RAPID URINE DRUG SCREEN, HOSP PERFORMED  HIV ANTIBODY (ROUTINE TESTING)  I-STAT CG4 LACTIC ACID, ED    EKG  EKG Interpretation  Date/Time:  Thursday April 02 2017 23:19:45 EST Ventricular Rate:  103 PR Interval:  144 QRS Duration: 80 QT Interval:  316 QTC Calculation: 413 R Axis:   88 Text Interpretation:  Sinus tachycardia Early repolarization Otherwise normal ECG No old tracing to compare Confirmed by Dione Booze ([id number]) on 04/02/2017 11:23:39 PM        Radiology Ct Abdomen Pelvis Wo Contrast  Result Date: 04/03/2017 CLINICAL DATA:  26 y/o M; history of Crohn's disease. Evaluation Crohn's exacerbation. Patient was dizzy with fall to concrete this evening. EXAM: CT ABDOMEN AND PELVIS WITHOUT CONTRAST TECHNIQUE: Multidetector CT imaging of the abdomen and pelvis was performed following the standard protocol without IV contrast. COMPARISON:  None. FINDINGS: Lower chest: No acute abnormality. Hepatobiliary: No focal liver abnormality is seen. No gallstones, gallbladder wall thickening, or biliary dilatation. Pancreas: Unremarkable. No pancreatic ductal dilatation or surrounding inflammatory changes. Spleen: Normal in size without focal abnormality. Adrenals/Urinary Tract: Adrenal glands are unremarkable. Kidneys are normal, without renal calculi, focal lesion, or hydronephrosis. Bladder is unremarkable. Stomach/Bowel: Stomach is within normal limits. Appendix appears normal. No evidence of bowel wall thickening, distention, or inflammatory changes. Vascular/Lymphatic: No significant vascular findings are present. No enlarged abdominal or pelvic lymph nodes. Reproductive: Uterus and bilateral adnexa are unremarkable. Other: No abdominal  wall hernia or abnormality. No abdominopelvic ascites. Musculoskeletal: No acute or significant osseous findings. IMPRESSION: No acute process identified. Active inflammation of Crohn's disease may not be identified without contrast. CT abdomen and pelvis with contrast or CT/MR enterography are the studies of choice for evaluation of Crohn's disease. Electronically Signed   By: Mitzi HansenLance  Furusawa-Stratton M.D.   On: 04/03/2017 03:01   Ct Head Wo Contrast  Result Date: 04/03/2017 CLINICAL DATA:  26 year old male with trauma. EXAM: CT HEAD WITHOUT CONTRAST CT MAXILLOFACIAL WITHOUT CONTRAST CT CERVICAL SPINE WITHOUT CONTRAST TECHNIQUE: Multidetector CT imaging of the head, cervical spine, and maxillofacial structures were  performed using the standard protocol without intravenous contrast. Multiplanar CT image reconstructions of the cervical spine and maxillofacial structures were also generated. COMPARISON:  None. FINDINGS: CT HEAD FINDINGS Brain: No evidence of acute infarction, hemorrhage, hydrocephalus, extra-axial collection or mass lesion/mass effect. Vascular: No hyperdense vessel or unexpected calcification. Skull: Normal. Negative for fracture or focal lesion. Other: None CT MAXILLOFACIAL FINDINGS Osseous: No fracture or mandibular dislocation. No destructive process. Orbits: Negative. No traumatic or inflammatory finding. Sinuses: Mild mucoperiosteal thickening of paranasal sinuses. No air-fluid levels. The mastoid air cells are clear. Soft tissues: Negative. CT CERVICAL SPINE FINDINGS Alignment: No acute subluxation. Mild reversal of normal cervical lordosis likely positional or due to muscle spasm. Skull base and vertebrae: No acute fracture. No primary bone lesion or focal pathologic process. Soft tissues and spinal canal: No prevertebral fluid or swelling. No visible canal hematoma. Disc levels:  No acute findings. Upper chest: Negative. Other: None IMPRESSION: 1. Normal noncontrast CT of the brain. 2. No acute facial bone fractures. 3. No acute/traumatic cervical spine pathology. Electronically Signed   By: Elgie CollardArash  Radparvar M.D.   On: 04/03/2017 02:58   Ct Cervical Spine Wo Contrast  Result Date: 04/03/2017 CLINICAL DATA:  26 year old male with trauma. EXAM: CT HEAD WITHOUT CONTRAST CT MAXILLOFACIAL WITHOUT CONTRAST CT CERVICAL SPINE WITHOUT CONTRAST TECHNIQUE: Multidetector CT imaging of the head, cervical spine, and maxillofacial structures were performed using the standard protocol without intravenous contrast. Multiplanar CT image reconstructions of the cervical spine and maxillofacial structures were also generated. COMPARISON:  None. FINDINGS: CT HEAD FINDINGS Brain: No evidence of acute infarction,  hemorrhage, hydrocephalus, extra-axial collection or mass lesion/mass effect. Vascular: No hyperdense vessel or unexpected calcification. Skull: Normal. Negative for fracture or focal lesion. Other: None CT MAXILLOFACIAL FINDINGS Osseous: No fracture or mandibular dislocation. No destructive process. Orbits: Negative. No traumatic or inflammatory finding. Sinuses: Mild mucoperiosteal thickening of paranasal sinuses. No air-fluid levels. The mastoid air cells are clear. Soft tissues: Negative. CT CERVICAL SPINE FINDINGS Alignment: No acute subluxation. Mild reversal of normal cervical lordosis likely positional or due to muscle spasm. Skull base and vertebrae: No acute fracture. No primary bone lesion or focal pathologic process. Soft tissues and spinal canal: No prevertebral fluid or swelling. No visible canal hematoma. Disc levels:  No acute findings. Upper chest: Negative. Other: None IMPRESSION: 1. Normal noncontrast CT of the brain. 2. No acute facial bone fractures. 3. No acute/traumatic cervical spine pathology. Electronically Signed   By: Elgie CollardArash  Radparvar M.D.   On: 04/03/2017 02:58   Dg Knee Complete 4 Views Left  Result Date: 04/03/2017 CLINICAL DATA:  26 year old male with fall and left knee pain. EXAM: LEFT KNEE - COMPLETE 4+ VIEW COMPARISON:  None. FINDINGS: No evidence of fracture, dislocation, or joint effusion. No evidence of arthropathy or other focal bone abnormality. Soft tissues are unremarkable. IMPRESSION: Negative. Electronically Signed  By: Elgie Collard M.D.   On: 04/03/2017 02:44   Dg Hand Complete Left  Result Date: 04/03/2017 CLINICAL DATA:  26 year old male with fall and left hand pain. EXAM: LEFT HAND - COMPLETE 3+ VIEW COMPARISON:  None. FINDINGS: There is no evidence of fracture or dislocation. There is no evidence of arthropathy or other focal bone abnormality. Soft tissues are unremarkable. IMPRESSION: Negative. Electronically Signed   By: Elgie Collard M.D.   On:  04/03/2017 02:43   Ct Maxillofacial Wo Contrast  Result Date: 04/03/2017 CLINICAL DATA:  26 year old male with trauma. EXAM: CT HEAD WITHOUT CONTRAST CT MAXILLOFACIAL WITHOUT CONTRAST CT CERVICAL SPINE WITHOUT CONTRAST TECHNIQUE: Multidetector CT imaging of the head, cervical spine, and maxillofacial structures were performed using the standard protocol without intravenous contrast. Multiplanar CT image reconstructions of the cervical spine and maxillofacial structures were also generated. COMPARISON:  None. FINDINGS: CT HEAD FINDINGS Brain: No evidence of acute infarction, hemorrhage, hydrocephalus, extra-axial collection or mass lesion/mass effect. Vascular: No hyperdense vessel or unexpected calcification. Skull: Normal. Negative for fracture or focal lesion. Other: None CT MAXILLOFACIAL FINDINGS Osseous: No fracture or mandibular dislocation. No destructive process. Orbits: Negative. No traumatic or inflammatory finding. Sinuses: Mild mucoperiosteal thickening of paranasal sinuses. No air-fluid levels. The mastoid air cells are clear. Soft tissues: Negative. CT CERVICAL SPINE FINDINGS Alignment: No acute subluxation. Mild reversal of normal cervical lordosis likely positional or due to muscle spasm. Skull base and vertebrae: No acute fracture. No primary bone lesion or focal pathologic process. Soft tissues and spinal canal: No prevertebral fluid or swelling. No visible canal hematoma. Disc levels:  No acute findings. Upper chest: Negative. Other: None IMPRESSION: 1. Normal noncontrast CT of the brain. 2. No acute facial bone fractures. 3. No acute/traumatic cervical spine pathology. Electronically Signed   By: Elgie Collard M.D.   On: 04/03/2017 02:58    Procedures Procedures (including critical care time)  Medications Ordered in ED Medications  sodium chloride 0.9 % bolus 1,000 mL (not administered)  ondansetron (ZOFRAN) injection 4 mg (not administered)  fentaNYL (SUBLIMAZE) injection 50 mcg  (not administered)  Tdap (BOOSTRIX) injection 0.5 mL (not administered)     Initial Impression / Assessment and Plan / ED Course  I have reviewed the triage vital signs and the nursing notes.  Pertinent labs & imaging results that were available during my care of the patient were reviewed by me and considered in my medical decision making (see chart for details).    Patient with syncopal episode likely orthostatic in setting of Crohn's exacerbation.  He has abrasions to his face, jaw, knee and hand.  EKG shows no Brugada no prolonged QT.  Patient orthostatic by heart rate.  He is given IV fluids.  He reports contrast allergy.  Patient given IV fluids.  His traumatic imaging is negative.  CT of his abdomen obtained without contrast given his allergy.  Difficult to evaluate for Crohn's flare without contrast.  Patient given empiric steroids for potential Crohn's flare.  He is symptomatically standing and feels lightheaded and dizzy still.  He does not feel safe going home and plan admission for hydration and further observation for orthostasis and setting up suspected Crohn's flare. D/w Dr. Antionette Char. Final Clinical Impressions(s) / ED Diagnoses   Final diagnoses:  Syncope and collapse  Exacerbation of Crohn's disease without complication [facility])    ED Discharge Orders    None       Glynn Octave, MD 04/03/17 [phone]

## 2017-04-03 NOTE — Consult Note (Signed)
Referring Provider: Dr. Hanley Ben Primary Care Physician:  Donn Pierini Primary Gastroenterologist:  Dr. Deniece Portela  Reason for Consultation:  Crohn's disease  HPI: Reginald Valentine is a 26 y.o. male who was diagnosed with Crohn's disease at the age of 58. He had a seton placed for a fistula-in-ano 2 or 3 years ago at [facility] and he has been maintained on Remicade eventually at every 4 weeks.  He moved to [facility] and established care with Dr. Deniece Portela in [place].  Was again started on Remicade every 8 weeks.  He recently had a colonoscopy with Dr. 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 The remainder of his colon was clear of disease.  He apparently was referred to a surgeon, Dr. Oren Bracket, who recommended surgery, but the patient declined.  He is here at [facility] ED today because of an episode of syncope last night.  He says that he has not been feeling well yesterday and then passed out and landed on on his left side.  He is being evaluated for this and ECHO is pending.  GI was called because he feels like he is having a flare of his Crohn's disease.  Says that he does not feel comfortable with his current GI doctor and wants to switch care.  Reports diffuse abdominal pain, diarrhea.  CT abdomen and pelvis without contrast (is allergic) did not show any evidence of active Crohn's.  Sed rate is 25 and CRP is 1.9.  Last Remicade infusion was 03/18/2017.  Received 80 mg dose of IV solumedrol in the ED.  Past Medical History:  Diagnosis Date  . Crohn's colitis (HCC)   . Folliculitis     History reviewed. No pertinent surgical history.  Prior to Admission medications   Medication Sig Start Date End Date Taking? Authorizing Provider  Charcoal Activated (ACTIVATED CHARCOAL PO) Take 2 tablets by mouth daily.   Yes [provider]  Lactobacillus (PROBIOTIC ACIDOPHILUS PO) Take 1 tablet by mouth daily.   Yes [provider]  Multiple Vitamin  (MULTIVITAMIN WITH MINERALS) TABS tablet Take 1 tablet by mouth daily.   Yes [provider]    Current Facility-Administered Medications  Medication Dose Route Frequency Provider Last Rate Last Dose  . acetaminophen (TYLENOL) tablet 650 mg  650 mg Oral Q6H PRN Opyd, Lavone Neri, MD   650 mg at 04/03/17 0423   Or  . acetaminophen (TYLENOL) suppository 650 mg  650 mg Rectal Q6H PRN Opyd, Lavone Neri, MD      . dicyclomine (BENTYL) capsule 10 mg  10 mg Oral Q6H PRN Alekh, Kshitiz, MD      . doxycycline (VIBRA-TABS) tablet 100 mg  100 mg Oral Q12H Opyd, Lavone Neri, MD   100 mg at 04/03/17 0423  . fluocinonide ointment (LIDEX) 0.05 %   Topical TID Opyd, Lavone Neri, MD      . HYDROcodone-acetaminophen (NORCO/VICODIN) 5-325 MG per tablet 1-2 tablet  1-2 tablet Oral Q4H PRN Opyd, Lavone Neri, MD   1 tablet at 04/03/17 1209  . multivitamin with minerals tablet 1 tablet  1 tablet Oral Daily Opyd, Lavone Neri, MD   1 tablet at 04/03/17 1209  . ondansetron (ZOFRAN) tablet 4 mg  4 mg Oral Q6H PRN Opyd, Lavone Neri, MD       Or  . ondansetron (ZOFRAN) injection 4 mg  4 mg Intravenous Q6H PRN Opyd, Lavone Neri, MD      . sodium chloride flush (NS)  0.9 % injection 3 mL  3 mL Intravenous Q12H Opyd, Lavone Neriimothy S, MD       Current Outpatient Medications  Medication Sig Dispense Refill  . Charcoal Activated (ACTIVATED CHARCOAL PO) Take 2 tablets by mouth daily.    . Lactobacillus (PROBIOTIC ACIDOPHILUS PO) Take 1 tablet by mouth daily.    . Multiple Vitamin (MULTIVITAMIN WITH MINERALS) TABS tablet Take 1 tablet by mouth daily.      Allergies as of 04/02/2017  . (Not on File)    History reviewed. No pertinent family history.  Social History   Socioeconomic History  . Marital status: Single    Spouse name: Not on file  . Number of children: Not on file  . Years of education: Not on file  . Highest education level: Not on file  Social Needs  . Financial resource strain: Not on file  . Food insecurity -  worry: Not on file  . Food insecurity - inability: Not on file  . Transportation needs - medical: Not on file  . Transportation needs - non-medical: Not on file  Occupational History  . Not on file  Tobacco Use  . Smoking status: Never Smoker  Substance and Sexual Activity  . Alcohol use: No    Frequency: Never  . Drug use: No  . Sexual activity: Not on file  Other Topics Concern  . Not on file  Social History Narrative  . Not on file    Review of Systems: ROS is O/W negative except as mentioned in HPI.  Physical Exam: Vital signs in last 24 hours: Temp:  [98.1 F (36.7 C)-98.5 F (36.9 C)] 98.5 F (36.9 C) (12/07 0804) Pulse Rate:  [73-110] 87 (12/07 1200) Resp:  [12-19] 18 (12/07 1200) BP: (95-123)/(51-85) 107/59 (12/07 1200) SpO2:  [98 %-100 %] 100 % (12/07 1200)   General:  Alert, Well-developed, well-nourished, cooperative in NAD Head:  Normocephalic and atraumatic. Eyes:  Sclera clear, no icterus.  Conjunctiva pink. Ears:  Normal auditory acuity. Mouth:  No deformity or lesions.   Lungs:  Clear throughout to auscultation.  No wheezes, crackles, or rhonchi.  Heart:  Regular rate and rhythm; no murmurs, clicks, rubs,  or gallops. Abdomen:  Soft, non-distended.  BS present.  Mild diffuse TTP. Rectal:  Deferred  Msk:  Symmetrical without gross deformities. Pulses:  Normal pulses noted. Extremities:  Without clubbing or edema. Neurologic:  Alert and oriented x 4;  grossly normal neurologically. Skin:  Follicular rash on arms and legs. Psych:  Alert and cooperative. Normal mood and affect.  Intake/Output from previous day: 12/06 0701 - 12/07 0700 In: 2000 [IV Piggyback:2000] Out: -   Lab Results: Recent Labs    04/02/17 2328 04/03/17 0403  WBC 9.8 9.0  HGB 13.6 11.7*  HCT 39.1 34.7*  PLT 330 302   BMET Recent Labs    04/02/17 2328 04/03/17 0403  NA 133* 136  K 3.9 3.8  CL 97* 104  CO2 27 25  GLUCOSE 108* 104*  BUN <5* <5*  CREATININE 1.08  0.90  CALCIUM 9.1 8.3*   Studies/Results: Ct Abdomen Pelvis Wo Contrast  Result Date: 04/03/2017 CLINICAL DATA:  26 y/o M; history of Crohn's disease. Evaluation Crohn's exacerbation. Patient was dizzy with fall to concrete this evening. EXAM: CT ABDOMEN AND PELVIS WITHOUT CONTRAST TECHNIQUE: Multidetector CT imaging of the abdomen and pelvis was performed following the standard protocol without IV contrast. COMPARISON:  None. FINDINGS: Lower chest: No acute abnormality. Hepatobiliary: No focal liver  abnormality is seen. No gallstones, gallbladder wall thickening, or biliary dilatation. Pancreas: Unremarkable. No pancreatic ductal dilatation or surrounding inflammatory changes. Spleen: Normal in size without focal abnormality. Adrenals/Urinary Tract: Adrenal glands are unremarkable. Kidneys are normal, without renal calculi, focal lesion, or hydronephrosis. Bladder is unremarkable. Stomach/Bowel: Stomach is within normal limits. Appendix appears normal. No evidence of bowel wall thickening, distention, or inflammatory changes. Vascular/Lymphatic: No significant vascular findings are present. No enlarged abdominal or pelvic lymph nodes. Reproductive: Uterus and bilateral adnexa are unremarkable. Other: No abdominal wall hernia or abnormality. No abdominopelvic ascites. Musculoskeletal: No acute or significant osseous findings. IMPRESSION: No acute process identified. Active inflammation of Crohn's disease may not be identified without contrast. CT abdomen and pelvis with contrast or CT/MR enterography are the studies of choice for evaluation of Crohn's disease. Electronically Signed   By: Mitzi Hansen M.D.   On: 04/03/2017 03:01   Ct Head Wo Contrast  Result Date: 04/03/2017 CLINICAL DATA:  26 year old male with trauma. EXAM: CT HEAD WITHOUT CONTRAST CT MAXILLOFACIAL WITHOUT CONTRAST CT CERVICAL SPINE WITHOUT CONTRAST TECHNIQUE: Multidetector CT imaging of the head, cervical spine, and  maxillofacial structures were performed using the standard protocol without intravenous contrast. Multiplanar CT image reconstructions of the cervical spine and maxillofacial structures were also generated. COMPARISON:  None. FINDINGS: CT HEAD FINDINGS Brain: No evidence of acute infarction, hemorrhage, hydrocephalus, extra-axial collection or mass lesion/mass effect. Vascular: No hyperdense vessel or unexpected calcification. Skull: Normal. Negative for fracture or focal lesion. Other: None CT MAXILLOFACIAL FINDINGS Osseous: No fracture or mandibular dislocation. No destructive process. Orbits: Negative. No traumatic or inflammatory finding. Sinuses: Mild mucoperiosteal thickening of paranasal sinuses. No air-fluid levels. The mastoid air cells are clear. Soft tissues: Negative. CT CERVICAL SPINE FINDINGS Alignment: No acute subluxation. Mild reversal of normal cervical lordosis likely positional or due to muscle spasm. Skull base and vertebrae: No acute fracture. No primary bone lesion or focal pathologic process. Soft tissues and spinal canal: No prevertebral fluid or swelling. No visible canal hematoma. Disc levels:  No acute findings. Upper chest: Negative. Other: None IMPRESSION: 1. Normal noncontrast CT of the brain. 2. No acute facial bone fractures. 3. No acute/traumatic cervical spine pathology. Electronically Signed   By: Elgie Collard M.D.   On: 04/03/2017 02:58   Ct Cervical Spine Wo Contrast  Result Date: 04/03/2017 CLINICAL DATA:  26 year old male with trauma. EXAM: CT HEAD WITHOUT CONTRAST CT MAXILLOFACIAL WITHOUT CONTRAST CT CERVICAL SPINE WITHOUT CONTRAST TECHNIQUE: Multidetector CT imaging of the head, cervical spine, and maxillofacial structures were performed using the standard protocol without intravenous contrast. Multiplanar CT image reconstructions of the cervical spine and maxillofacial structures were also generated. COMPARISON:  None. FINDINGS: CT HEAD FINDINGS Brain: No evidence  of acute infarction, hemorrhage, hydrocephalus, extra-axial collection or mass lesion/mass effect. Vascular: No hyperdense vessel or unexpected calcification. Skull: Normal. Negative for fracture or focal lesion. Other: None CT MAXILLOFACIAL FINDINGS Osseous: No fracture or mandibular dislocation. No destructive process. Orbits: Negative. No traumatic or inflammatory finding. Sinuses: Mild mucoperiosteal thickening of paranasal sinuses. No air-fluid levels. The mastoid air cells are clear. Soft tissues: Negative. CT CERVICAL SPINE FINDINGS Alignment: No acute subluxation. Mild reversal of normal cervical lordosis likely positional or due to muscle spasm. Skull base and vertebrae: No acute fracture. No primary bone lesion or focal pathologic process. Soft tissues and spinal canal: No prevertebral fluid or swelling. No visible canal hematoma. Disc levels:  No acute findings. Upper chest: Negative. Other: None IMPRESSION: 1. Normal noncontrast  CT of the brain. 2. No acute facial bone fractures. 3. No acute/traumatic cervical spine pathology. Electronically Signed   By: Elgie Collard M.D.   On: 04/03/2017 02:58   Dg Knee Complete 4 Views Left  Result Date: 04/03/2017 CLINICAL DATA:  26 year old male with fall and left knee pain. EXAM: LEFT KNEE - COMPLETE 4+ VIEW COMPARISON:  None. FINDINGS: No evidence of fracture, dislocation, or joint effusion. No evidence of arthropathy or other focal bone abnormality. Soft tissues are unremarkable. IMPRESSION: Negative. Electronically Signed   By: Elgie Collard M.D.   On: 04/03/2017 02:44   Dg Hand Complete Left  Result Date: 04/03/2017 CLINICAL DATA:  26 year old male with fall and left hand pain. EXAM: LEFT HAND - COMPLETE 3+ VIEW COMPARISON:  None. FINDINGS: There is no evidence of fracture or dislocation. There is no evidence of arthropathy or other focal bone abnormality. Soft tissues are unremarkable. IMPRESSION: Negative. Electronically Signed   By: Elgie Collard M.D.   On: 04/03/2017 02:43   Ct Maxillofacial Wo Contrast  Result Date: 04/03/2017 CLINICAL DATA:  26 year old male with trauma. EXAM: CT HEAD WITHOUT CONTRAST CT MAXILLOFACIAL WITHOUT CONTRAST CT CERVICAL SPINE WITHOUT CONTRAST TECHNIQUE: Multidetector CT imaging of the head, cervical spine, and maxillofacial structures were performed using the standard protocol without intravenous contrast. Multiplanar CT image reconstructions of the cervical spine and maxillofacial structures were also generated. COMPARISON:  None. FINDINGS: CT HEAD FINDINGS Brain: No evidence of acute infarction, hemorrhage, hydrocephalus, extra-axial collection or mass lesion/mass effect. Vascular: No hyperdense vessel or unexpected calcification. Skull: Normal. Negative for fracture or focal lesion. Other: None CT MAXILLOFACIAL FINDINGS Osseous: No fracture or mandibular dislocation. No destructive process. Orbits: Negative. No traumatic or inflammatory finding. Sinuses: Mild mucoperiosteal thickening of paranasal sinuses. No air-fluid levels. The mastoid air cells are clear. Soft tissues: Negative. CT CERVICAL SPINE FINDINGS Alignment: No acute subluxation. Mild reversal of normal cervical lordosis likely positional or due to muscle spasm. Skull base and vertebrae: No acute fracture. No primary bone lesion or focal pathologic process. Soft tissues and spinal canal: No prevertebral fluid or swelling. No visible canal hematoma. Disc levels:  No acute findings. Upper chest: Negative. Other: None IMPRESSION: 1. Normal noncontrast CT of the brain. 2. No acute facial bone fractures. 3. No acute/traumatic cervical spine pathology. Electronically Signed   By: Elgie Collard M.D.   On: 04/03/2017 02:58   IMPRESSION:  *26 year old male who is being admitted to observation for evaluation of a syncopal episode.  ECHO results pending.  He has long-standing Crohn's disease that is treated with Remicade.  It does not necessarily appear  that he is having a significant Crohn's flare from non-contrast CT scan and inflammatory markers.  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  PLAN: *Overall the patient does not need admission from a GI standpoint.  He can be started on prednisone 40 mg daily and discharged with that to follow-up with his primary GI within one week for taper instructions.  It appears that he is getting adequate care with Dr. Tiburcio Pea and we do not plan to accept him as a new patient at this time.   Princella Pellegrini. Zehr  04/03/2017, 12:55 PM  Pager number [phone]   ________________________________________________________________________  [facility] MD note:  I personally examined the patient, reviewed the data and agree with the assessment and plan described above.   Rob Bunting, MD [facility] Pager [phone]

## 2017-04-03 NOTE — Progress Notes (Signed)
  Echocardiogram 2D Echocardiogram has been performed.  Janalyn Harder 04/03/2017, 11:05 AM

## 2017-04-03 NOTE — ED Notes (Signed)
Attending physician reports that plan is discharge, but we are waiting for care management consult for PCP set up

## 2018-05-23 IMAGING — DX DG HAND COMPLETE 3+V*L*
3 series · 3 of 3 positions shown · non-contrast
Comparison: None.

CLINICAL DATA: 26-year-old male with fall and left hand pain.

EXAM:
LEFT HAND - COMPLETE 3+ VIEW

[x hand pa left]
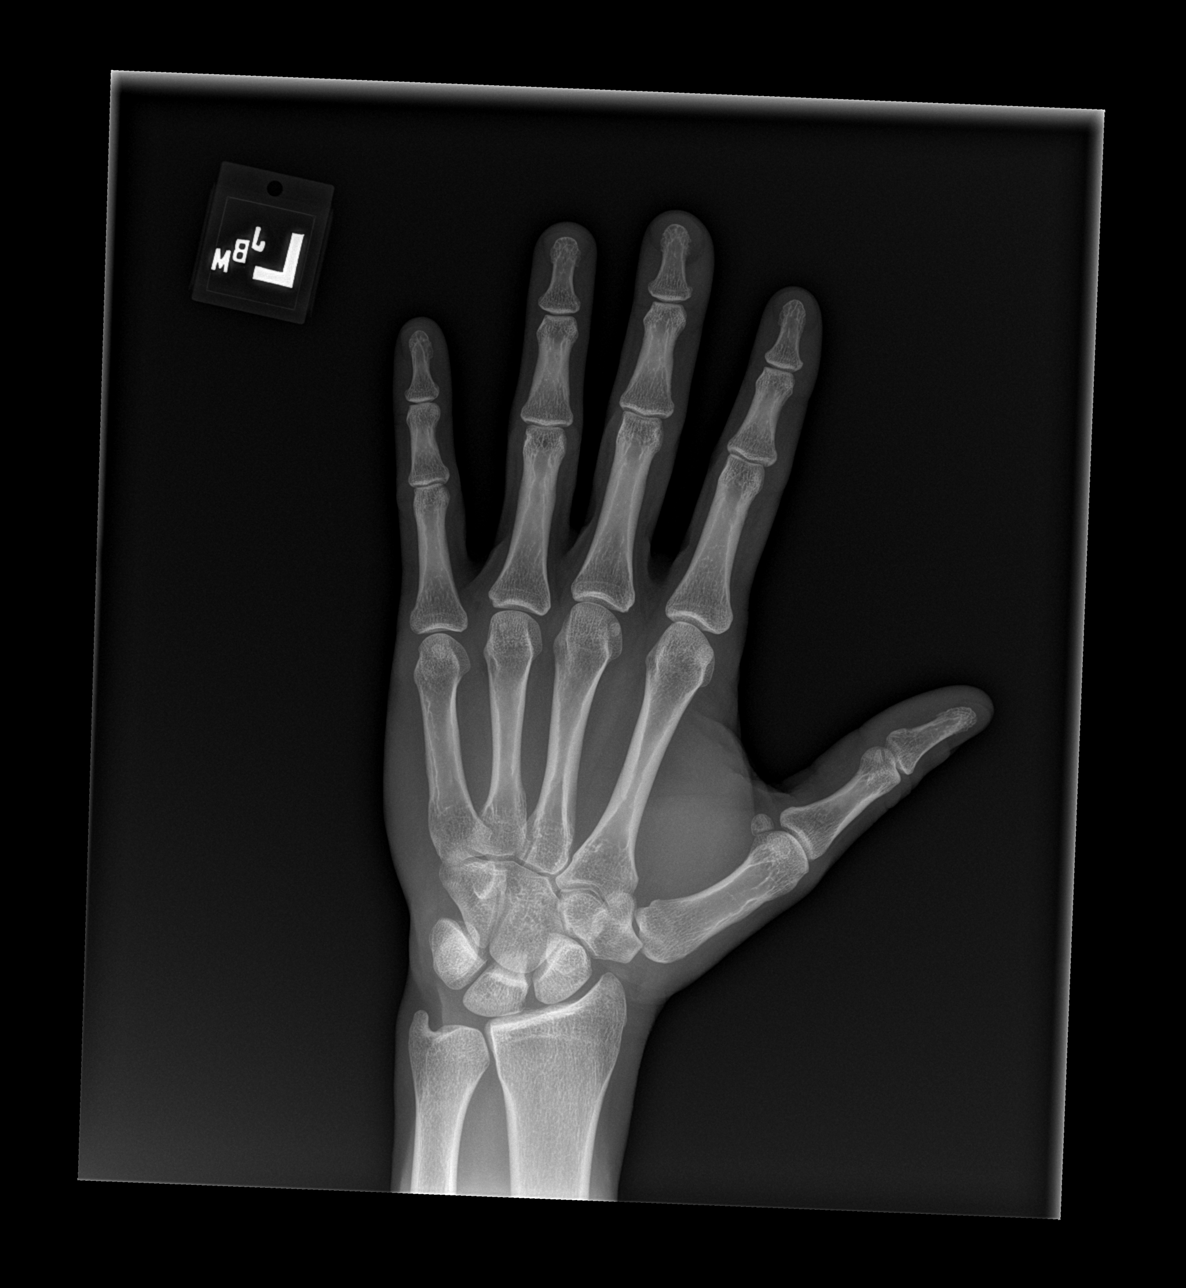

[x hand obl left]
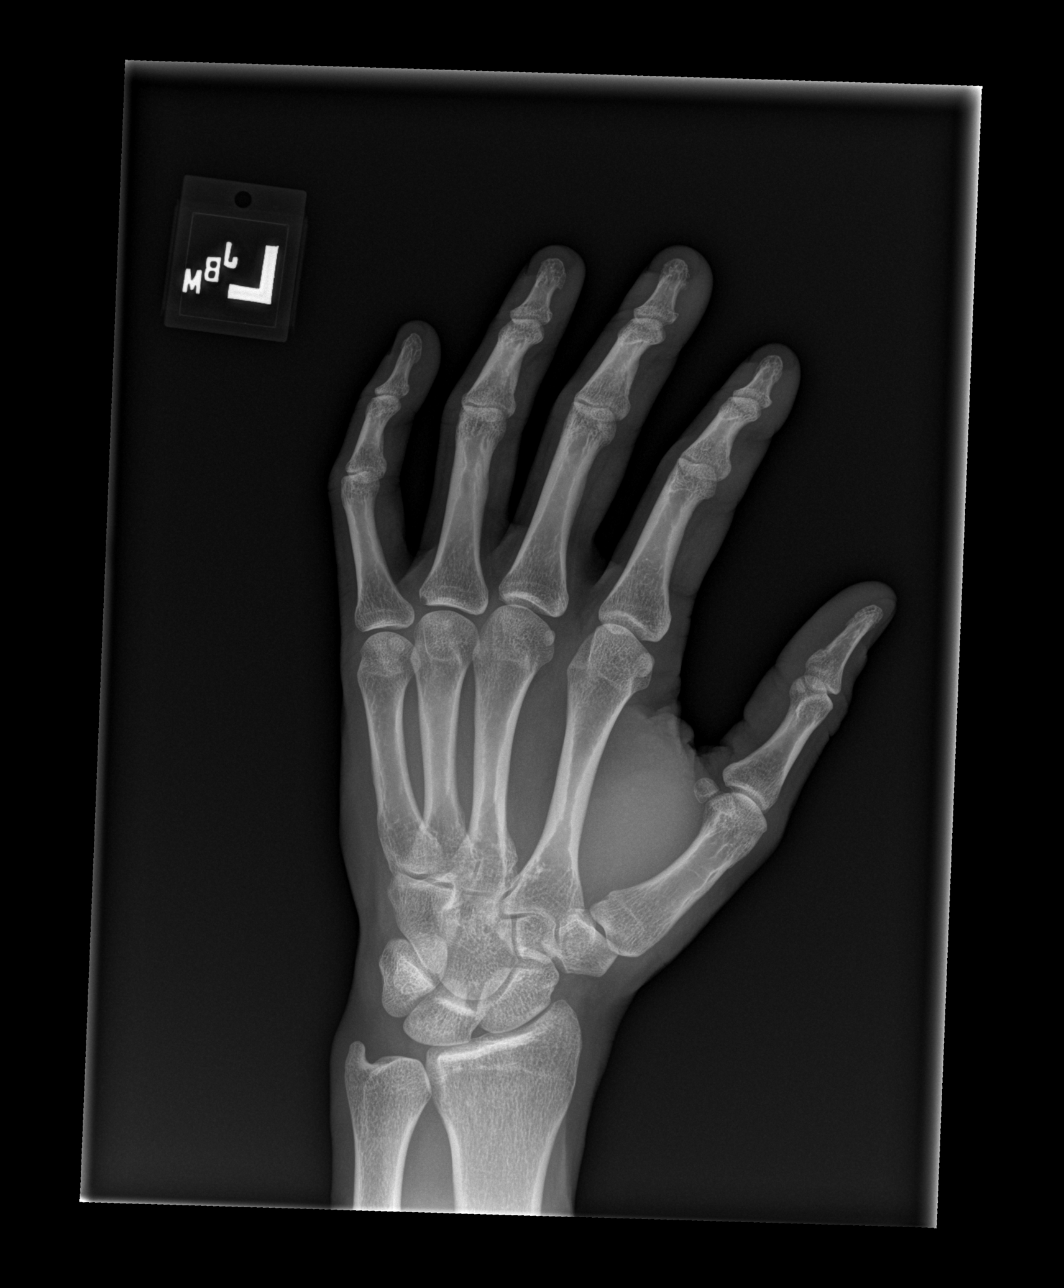

[x hand lat left]
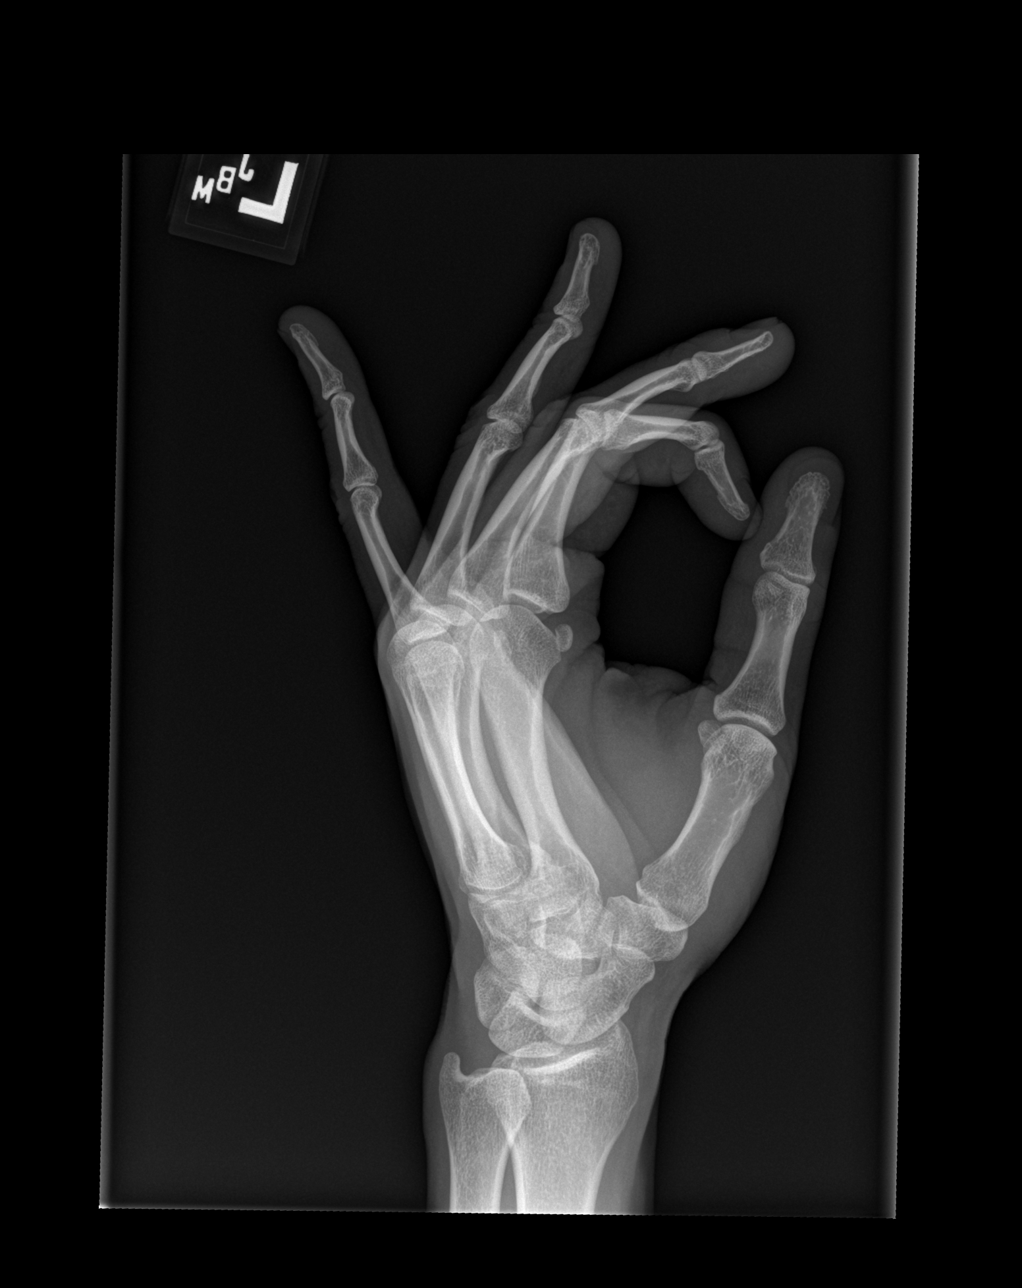

[3 of 3 positions shown; findings below may reference images not displayed]

FINDINGS: There is no evidence of fracture or dislocation. There is no
evidence of arthropathy or other focal bone abnormality. Soft
tissues are unremarkable.
IMPRESSION: Negative.

## 2018-05-23 IMAGING — CT CT ABD-PELV W/O CM
2 of 4 series · 16 of 46 positions shown, 18 images · non-contrast
Comparison: None.

CLINICAL DATA: 26 y/o M; history of Crohn's disease. Evaluation
Crohn's exacerbation. Patient was dizzy with fall to concrete this
evening.

EXAM:
CT ABDOMEN AND PELVIS WITHOUT CONTRAST
TECHNIQUE: Multidetector CT imaging of the abdomen and pelvis was performed
following the standard protocol without IV contrast.

[Series 3: a/p w/o 5mm · axial · non-contrast · 0.62mm/px · z∈[-907,-497]mm · 13 of 90 slices shown, 15 images]
[im 4/90  soft-tissue]
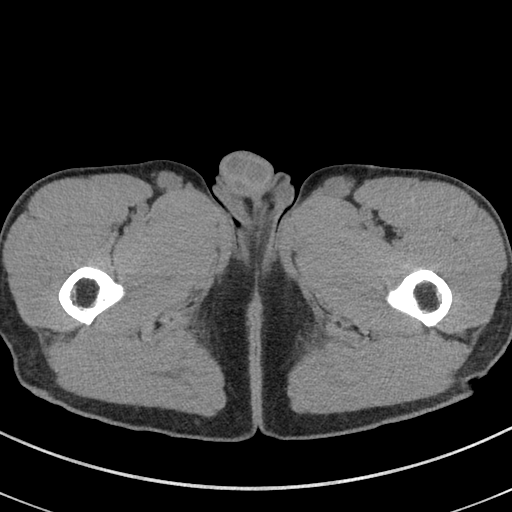
[im 4/90  bone]
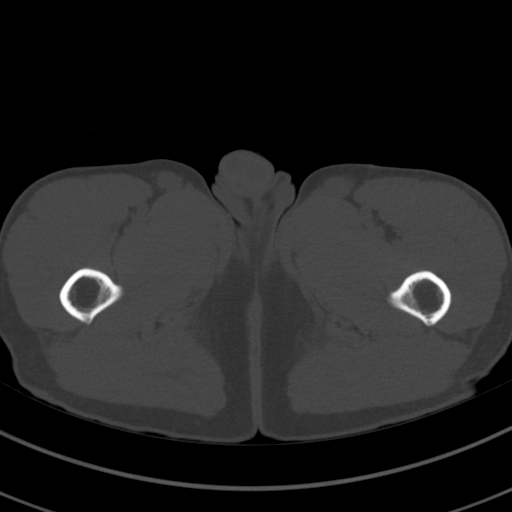
[im 11/90  soft-tissue]
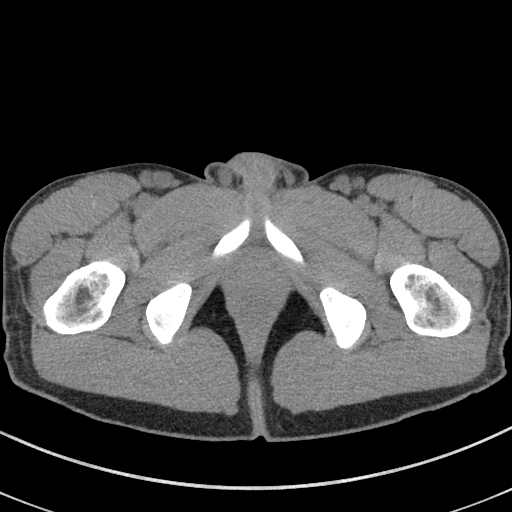
[im 18/90  soft-tissue]
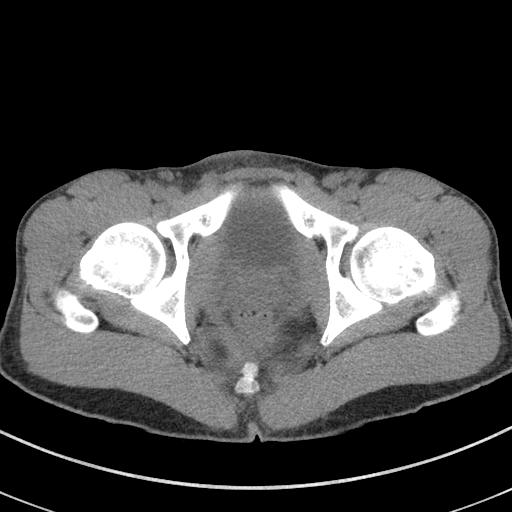
[im 24/90  soft-tissue]
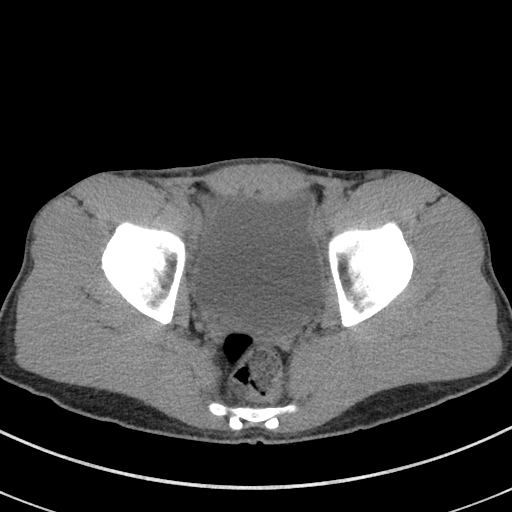
[im 31/90  soft-tissue]
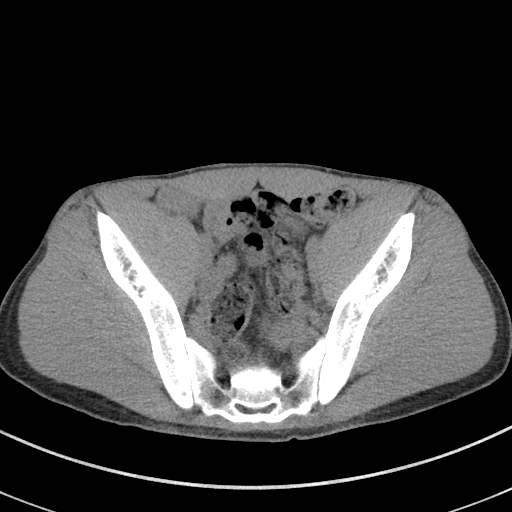
[im 38/90  soft-tissue]
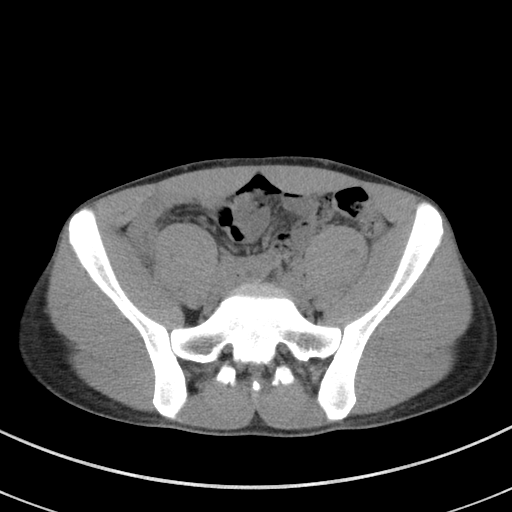
[im 45/90  soft-tissue]
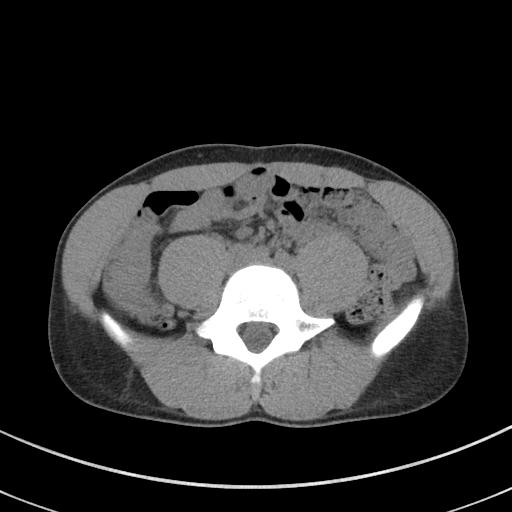
[im 52/90  soft-tissue]
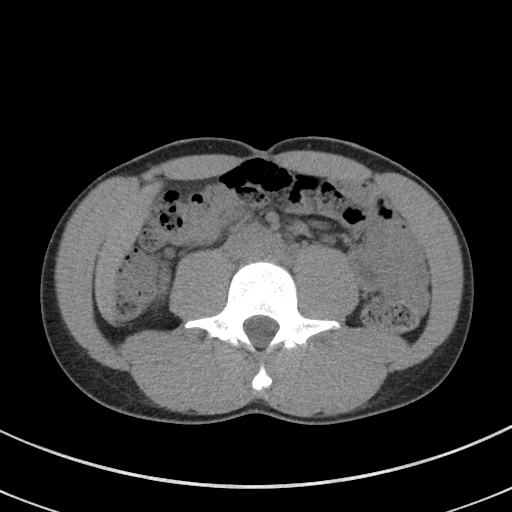
[im 59/90  soft-tissue]
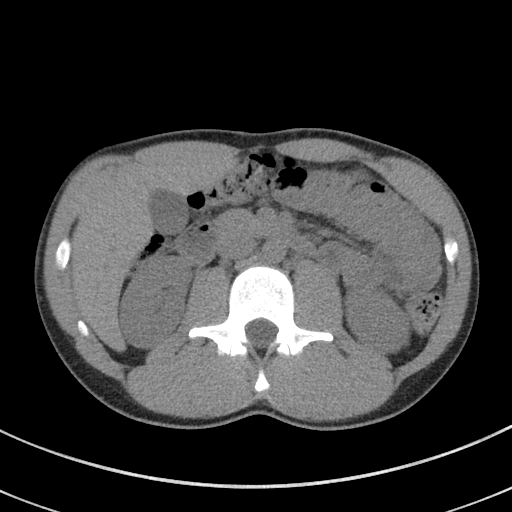
[im 59/90  bone]
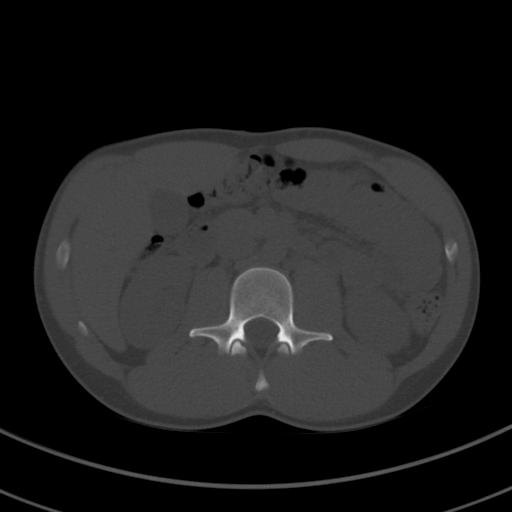
[im 66/90  soft-tissue]
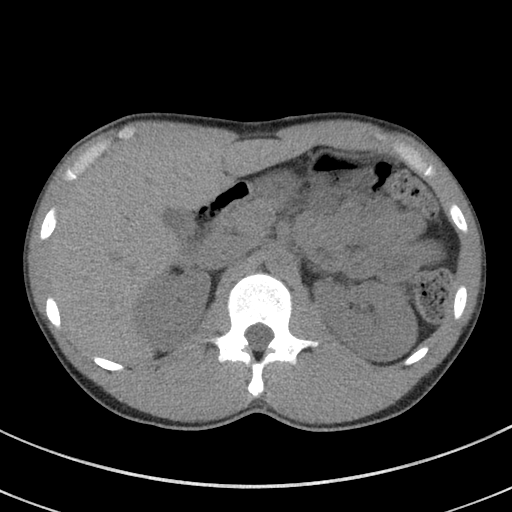
[im 72/90  soft-tissue]
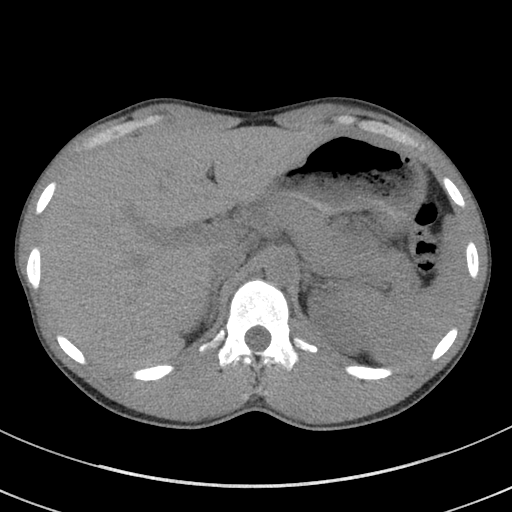
[im 79/90  soft-tissue]
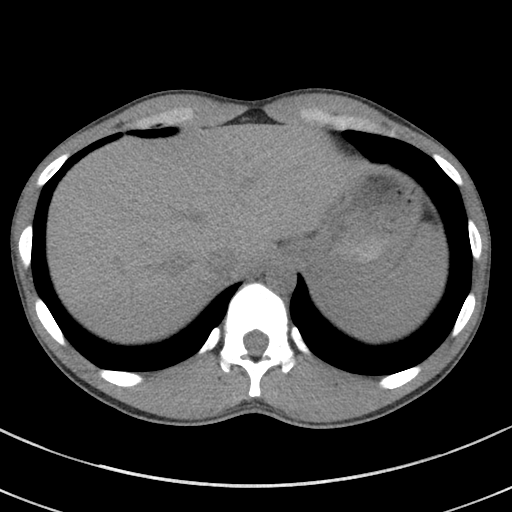
[im 86/90  soft-tissue]
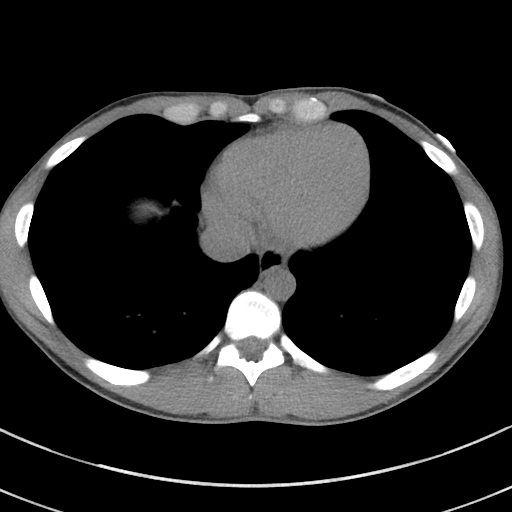

[Series 6: a/p w/o cor · coronal · non-contrast · 0.63mm/px · 3 of 109 slices shown]
[im 37/109  soft-tissue]
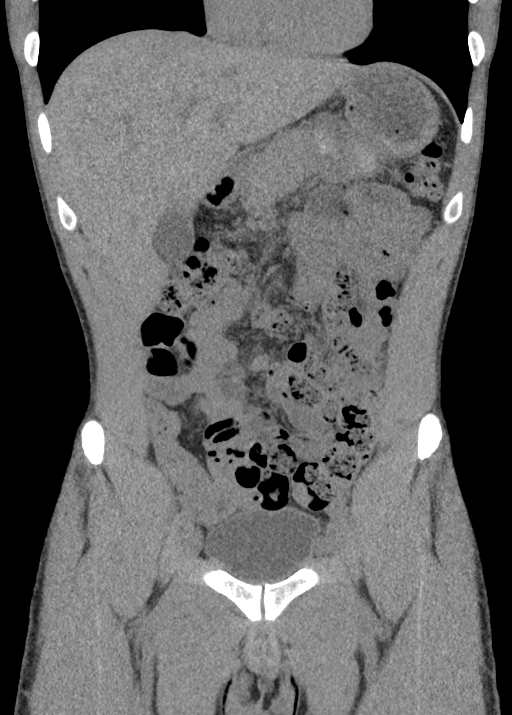
[im 49/109  soft-tissue]
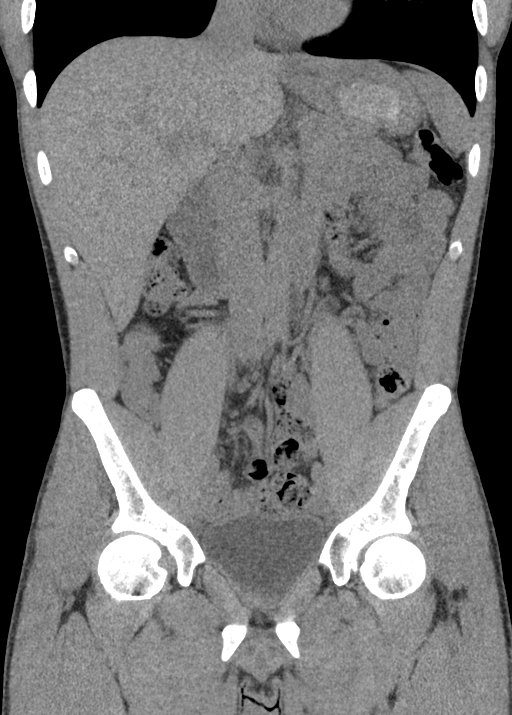
[im 61/109  soft-tissue]
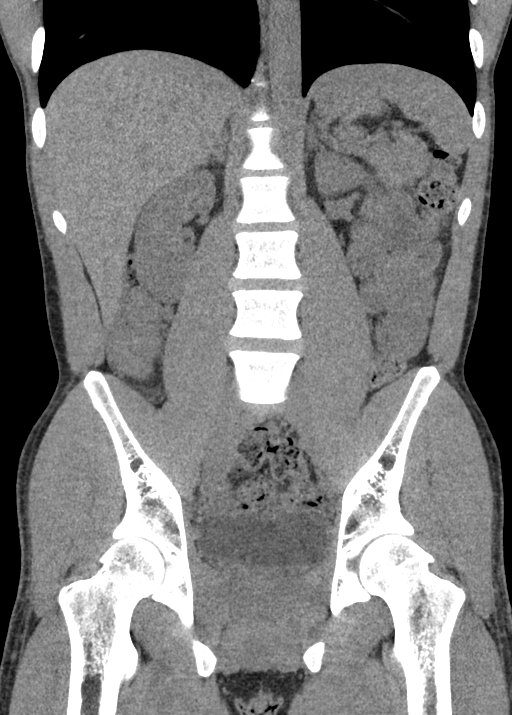

[16 of 46 positions shown; findings below may reference images not displayed]

FINDINGS: Lower chest: No acute abnormality.

Hepatobiliary: No focal liver abnormality is seen. No gallstones,
gallbladder wall thickening, or biliary dilatation.

Pancreas: Unremarkable. No pancreatic ductal dilatation or
surrounding inflammatory changes.

Spleen: Normal in size without focal abnormality.

Adrenals/Urinary Tract: Adrenal glands are unremarkable. Kidneys are
normal, without renal calculi, focal lesion, or hydronephrosis.
Bladder is unremarkable.

Stomach/Bowel: Stomach is within normal limits. Appendix appears
normal. No evidence of bowel wall thickening, distention, or
inflammatory changes.

Vascular/Lymphatic: No significant vascular findings are present. No
enlarged abdominal or pelvic lymph nodes.

Reproductive: Uterus and bilateral adnexa are unremarkable.

Other: No abdominal wall hernia or abnormality. No abdominopelvic
ascites.

Musculoskeletal: No acute or significant osseous findings.
IMPRESSION: No acute process identified. Active inflammation of Crohn's disease
may not be identified without contrast. CT abdomen and pelvis with
contrast or CT/MR enterography are the studies of choice for
evaluation of Crohn's disease.

By: Yulissa Chenevert M.D.
